# Patient Record
Sex: Male | Born: 1985 | Race: White | Hispanic: No | Marital: Single | Smoking: Never smoker
Health system: Southern US, Community
[De-identification: ages and names within clinical notes are randomized; demographics above are authoritative.]

## PROBLEM LIST (undated history)

## (undated) ENCOUNTER — Emergency Department (HOSPITAL_COMMUNITY): Admission: EM | Payer: No Typology Code available for payment source

## (undated) DIAGNOSIS — N200 Calculus of kidney: Secondary | ICD-10-CM

---

## 2015-03-23 ENCOUNTER — Emergency Department (HOSPITAL_COMMUNITY)
Admission: EM | Admit: 2015-03-23 | Discharge: 2015-03-23 | Disposition: A | Discharge status: Home / Self Care | Payer: Self-pay | Attending: Emergency Medicine | Admitting: Emergency Medicine

## 2015-03-23 ENCOUNTER — Encounter (HOSPITAL_COMMUNITY): Payer: Self-pay | Admitting: Neurology

## 2015-03-23 ENCOUNTER — Emergency Department (HOSPITAL_COMMUNITY): Payer: Self-pay

## 2015-03-23 DIAGNOSIS — N200 Calculus of kidney: Secondary | ICD-10-CM | POA: Insufficient documentation

## 2015-03-23 DIAGNOSIS — F17200 Nicotine dependence, unspecified, uncomplicated: Secondary | ICD-10-CM | POA: Insufficient documentation

## 2015-03-23 DIAGNOSIS — Z7982 Long term (current) use of aspirin: Secondary | ICD-10-CM | POA: Insufficient documentation

## 2015-03-23 LAB — COMPREHENSIVE METABOLIC PANEL
ALK PHOS: 120 U/L (ref 38–126)
ALT: 50 U/L (ref 17–63)
ANION GAP: 12 (ref 5–15)
AST: 32 U/L (ref 15–41)
Albumin: 4.9 g/dL (ref 3.5–5.0)
BILIRUBIN TOTAL: 0.8 mg/dL (ref 0.3–1.2)
BUN: 15 mg/dL (ref 6–20)
CALCIUM: 10.4 mg/dL — AB (ref 8.9–10.3)
CO2: 27 mmol/L (ref 22–32)
Chloride: 105 mmol/L (ref 101–111)
Creatinine, Ser: 1.19 mg/dL (ref 0.61–1.24)
Glucose, Bld: 137 mg/dL — ABNORMAL HIGH (ref 65–99)
Potassium: 4.7 mmol/L (ref 3.5–5.1)
Sodium: 144 mmol/L (ref 135–145)
TOTAL PROTEIN: 7.9 g/dL (ref 6.5–8.1)

## 2015-03-23 LAB — URINALYSIS, ROUTINE W REFLEX MICROSCOPIC
Bilirubin Urine: NEGATIVE
GLUCOSE, UA: NEGATIVE mg/dL
KETONES UR: 15 mg/dL — AB
Nitrite: NEGATIVE
PH: 7.5 (ref 5.0–8.0)
PROTEIN: 30 mg/dL — AB
Specific Gravity, Urine: 1.023 (ref 1.005–1.030)

## 2015-03-23 LAB — URINE MICROSCOPIC-ADD ON

## 2015-03-23 LAB — CBC
HCT: 46.1 % (ref 39.0–52.0)
HEMOGLOBIN: 15.7 g/dL (ref 13.0–17.0)
MCH: 30.1 pg (ref 26.0–34.0)
MCHC: 34.1 g/dL (ref 30.0–36.0)
MCV: 88.5 fL (ref 78.0–100.0)
PLATELETS: 223 10*3/uL (ref 150–400)
RBC: 5.21 MIL/uL (ref 4.22–5.81)
RDW: 13.2 % (ref 11.5–15.5)
WBC: 14.9 10*3/uL — AB (ref 4.0–10.5)

## 2015-03-23 LAB — LIPASE, BLOOD: Lipase: 27 U/L (ref 11–51)

## 2015-03-23 MED ORDER — CEPHALEXIN 500 MG PO CAPS: 500.0000 mg | ORAL_CAPSULE | Freq: Two times a day (BID) | ORAL | Status: DC

## 2015-03-23 MED ORDER — ONDANSETRON 4 MG PO TBDP: 4.0000 mg | ORAL_TABLET | Freq: Three times a day (TID) | ORAL | Status: DC | PRN

## 2015-03-23 MED ORDER — IBUPROFEN 800 MG PO TABS: 800.0000 mg | ORAL_TABLET | Freq: Three times a day (TID) | ORAL | Status: DC

## 2015-03-23 MED ORDER — ONDANSETRON 4 MG PO TBDP
4.0000 mg | ORAL_TABLET | Freq: Once | ORAL | Status: AC | PRN
  Administered 2015-03-23: 4 mg via ORAL

## 2015-03-23 MED ORDER — ONDANSETRON 4 MG PO TBDP
ORAL_TABLET | ORAL | Status: AC
  Filled 2015-03-23: qty 1

## 2015-03-23 MED ORDER — ONDANSETRON HCL 4 MG/2ML IJ SOLN
4.0000 mg | Freq: Once | INTRAMUSCULAR | Status: AC
  Administered 2015-03-23: 4 mg via INTRAVENOUS
  Filled 2015-03-23: qty 2

## 2015-03-23 MED ORDER — CYCLOBENZAPRINE HCL 10 MG PO TABS
10.0000 mg | ORAL_TABLET | Freq: Once | ORAL | Status: AC
  Administered 2015-03-23: 10 mg via ORAL
  Filled 2015-03-23: qty 1

## 2015-03-23 MED ORDER — SODIUM CHLORIDE 0.9 % IV BOLUS (SEPSIS)
1000.0000 mL | Freq: Once | INTRAVENOUS | Status: AC
  Administered 2015-03-23: 1000 mL via INTRAVENOUS

## 2015-03-23 NOTE — Discharge Instructions (Signed)
Take your medications as prescribed. Continue drinking fluids to remain hydrated. Please follow up with a primary care provider from the Resource Guide provided below in 4-5 days as needed. Please return to the Emergency Department if symptoms worsen or new onset of fever, abdominal pain, vomiting, diarrhea, blood in urine or stool, urinary retention, worsening back pain.   Emergency Department Resource Guide 1) Find a Doctor and Pay Out of Pocket Although you won't have to find out who is covered by your insurance plan, it is a good idea to ask around and get recommendations. You will then need to call the office and see if the doctor you have chosen will accept you as a new patient and what types of options they offer for patients who are self-pay. Some doctors offer discounts or will set up payment plans for their patients who do not have insurance, but you will need to ask so you aren't surprised when you get to your appointment.  2) Contact Your Local Health Department Not all health departments have doctors that can see patients for sick visits, but many do, so it is worth a call to see if yours does. If you don't know where your local health department is, you can check in your phone book. The CDC also has a tool to help you locate your state's health department, and many state websites also have listings of all of their local health departments.  3) Find a Walk-in Clinic If your illness is not likely to be very severe or complicated, you may want to try a walk in clinic. These are popping up all over the country in pharmacies, drugstores, and shopping centers. They're usually staffed by nurse practitioners or physician assistants that have been trained to treat common illnesses and complaints. They're usually fairly quick and inexpensive. However, if you have serious medical issues or chronic medical problems, these are probably not your best option.  No Primary Care Doctor: - Call Health  Connect at  [phone] - they can help you locate a primary care doctor that  accepts your insurance, provides certain services, etc. - Physician Referral Service- [phone]  Chronic Pain Problems: Organization         Address  Phone   Notes  [facility]  [phone] Patients need to be referred by their primary care doctor.   Medication Assistance: Organization         Address  Phone   Notes  [facility] Med[facility] [address] [place] [place], [place] [address] [phone] --Must be a resident of [facility] -- Must have NO insurance coverage whatsoever (no Medicaid/ Medicare, etc.) -- The pt. MUST have a primary care doctor that directs their care regularly and follows them in the community   MedAssist  [phone]   [employer]  [phone]    Agencies that provide inexpensive medical care: Organization         Address  Phone   Notes  [facility]  [phone]   [facility]    [phone]   [facility] [address] [place], [place] [address] [phone]   [facility] [address] [place]. [address], [place] [phone]   Planned Parenthood    [phone]   [facility]    [phone]   [facility]  [address], [place] Phone:  [phone], Fax:  [phone] Hours of Operation:  9 am - 6 pm, M-F.  Also accepts Medicaid/Medicare and self-pay.  [facility] [place], [address], [place] Phone: [phone], Fax: [phone]. Hours of Operation:  8:30 am - 5:30 pm, M-F.  Also accepts Medicaid and self-pay.  [facility] [place] [address] [place] Phone: [phone]   [place], [place], [place] [phone], Ext. [phone] Mondays & Thursdays: 7-9 AM.  First 15 patients are seen on a first come, first serve basis.     [place] Providers:  Organization         Address  Phone   Notes  [facility] [address], [place] [phone] Also accepts self-pay patients.  [facility] [address], [place]  [phone]   [place], [address], [place] [phone]   [facility] [address], [place] [phone]   Lucianne Lei [address], [place]   [phone] Only accepts [place] Access [place] patients after they have their name applied to their card.   Self-Pay (no insurance) in [facility]:  Organization         Address  Phone   Notes  Sickle Cell Patients, [facility] [place] [phone]   [facility] [place] [phone]   [facility] [place]  [place], [place], [place] [phone]   [facility]/Dr. Osei-Bonsu  [address] [place] or [address], [place] [phone] Phone number for both [place] and [place] locations is the same.  [facility] (1-Rh) [address], [place] [phone]   [facility] [address], [place] or [address] [phone]   [facility] [address] [place] [phone], phone; [phone], fax Sees patients 1st and 3rd Saturday of every month.  Must not qualify for public or private insurance (i.e. Medicaid, Medicare, [place] Health Choice, Veterans' Benefits)  Household income should be no more than 200% of the poverty level The clinic cannot treat you if you are pregnant or think you are pregnant  Sexually transmitted diseases are not treated at the clinic.    Dental Care: Organization         Address  Phone  Notes  [facility]  Department of [facility] [phone] Accepts children up to age 76 who are enrolled in [place] or [place]; pregnant women with a Medicaid card; and children who have applied for Medicaid or [place] Health Choice, but were declined, whose parents can pay a reduced fee at time of service.  [facility] of [facility]  [address], [place] [phone] Accepts children up to age 62 who are enrolled in [place] or [place]; pregnant women with a Medicaid card; and children who have applied for Medicaid or [place] Health Choice, but were declined, whose parents can pay a reduced fee at time of service.  [facility]  [place] [phone] Patients are seen by appointment only. Walk-ins are not accepted. [place]  [facility] will see patients 76 years of age and older. Monday - Tuesday (8am-5pm) Most Wednesdays (8:30-5pm) $30 per visit, cash only  [facility] Access PRO[facility]  [address], [facility] [phone] Patients are seen by appointment only. Walk-ins are not accepted. [place] will see patients 23 years of age and older. One Wednesday Evening (Monthly: Volunteer Based).  $30 per visit, cash only  [place]  [phone] for adults; Children under age 29, call Graduate Pediatric Dentistry at [phone]. Children aged 14-14, please call [phone] to request a pediatric application.  Dental services are provided in all areas of dental care including fillings, crowns and bridges, complete and partial dentures, implants, gum treatment, root canals, and extractions. Preventive care is also provided. Treatment is provided to both adults and children. Patients are selected via a lottery and there is often a waiting list.   [facility] [address] [place]  [phone]  [email or web address]   [facility] [address] [place], [place] [phone], Ext. [phone] Second and Fourth Thursday of each month, opens at 6:30 AM; Clinic ends at 9 AM.  Patients are seen on a first-come first-served basis, and a limited number are seen during each clinic.   [facility]  [address] Hillard Danker [place], [place] [phone]   Eligibility Requirements You must have lived in [place], [place], or [place] counties for at least the last three months.   You cannot be eligible for state or federal sponsored [employer], including [employer], [place], or [employer].   You generally cannot be eligible for healthcare insurance through your employer.    How to apply: Eligibility screenings are held every Tuesday and Wednesday afternoon from 1:00 pm until 4:00 pm. You do not need an appointment for the interview!  [facility] [address] [place], [place]   [place]  [place] Department  [place]  [phone]    Behavioral Health Resources in the Community: Intensive Outpatient Programs Organization         Address  Phone  Notes  [place]. [address], [place], [place] [phone]   [facility] Outpatient [address] [place], [place]   ADS: [facility] [address], [place], [place]   [place] [address]  [place], [place] or [phone]   Substance Abuse Resources Organization         Address  Phone  Notes  Al[facility] Drug Services  [phone]   [place]  [phone]   The [place]   Chinita Pester  [phone]   Residential & Outpatient Substance Abuse Program  [phone]   Psychological Services Organization          Address  Phone  Notes  [facility] [place]  [place]  [phone]   [place] [address] [place] or [phone]    Mobile Crisis Teams Organization         Address  Phone  Notes  [facility], Mobile Crisis Care Unit  [phone]   [facility]  [address] [place], [place]   [facility] [address] [place] [phone]    Self-Help/Support Groups Organization  Address  Phone             Notes  [place]. of [place] - variety of support groups  [place] Call for more information  Narcotics Anonymous (NA), [facility] [address], [employer] [place]  2 meetings at this location   Special educational needs teacher         Address  Phone  Notes  [facility] [place],    [place]  [phone]   [facility]  [address] [place] [phone], [place], [place]   [place], [place] [phone] Admissions: 8am-3pm M-F  Incentives Substance [place] [address],    [place], [place] [phone]   [facility] [address] [place], [place], [place]   [facility] [address]  [place], [place]   [facility] - Intensive Outpatient [address] Kristeen Mans [address], [place], [place]   [facility] ([place].,  [place], [place] [phone] or [phone]   [facility] (RTS) [address] [place], [place] Accepts Medicaid  [facility] [address],  [place] [phone] Substance Abuse/Addiction Treatment   [facility] Organization         Address  Phone  Notes  [facility]  [phone]   Domenic Schwab, PhD [address] Arlis Porta [place], [place]   [phone] or [phone]   [place], [place] [phone]   [facility] [address], [place], [place] [phone] Insurance/Medicaid/sponsorship through [facility] and Families [address] [place]                                    [place], [place] [phone] [place] [address], [place] [phone]    Dr. Adele Schilder  [phone]   [facility] Dept. 1) [address] [place] 2) [place] 3)  [place] [address], [place] [phone]  [phone]   [place] [phone] or [phone] (After Hours)

## 2015-03-23 NOTE — ED Notes (Addendum)
Pt reports LLQ abd pain since 1330 today, has been vomiting with chills. Denies diarrhea, LBM was today. Pt is a x 4. Denies urinary s/s.

## 2015-03-23 NOTE — ED Provider Notes (Signed)
CSN: [id number]     Arrival date & time 03/23/15  1730 History  By signing my name below, I, Stuart Cain, attest that this documentation has been prepared under the direction and in the presence of Stuart Cain, [place]. Electronically Signed: Ronney Cain, ED Scribe. 03/23/2015. 8:26 PM.    Chief Complaint  Patient presents with  . Abdominal Pain   The history is provided by the patient. No language interpreter was used.   HPI Comments: Stuart Cain is a 30 y.o. male who presents to the Emergency Department complaining of sudden-onset, constant, waxing and waning, sharp, 5/10 left-sided back pain radiating to his left abdomen that began this morning while at work. He also notes associated nausea that has since resolved, NBNB vomiting, subjective fever, mild dysuria, and urinary frequency. He states his pain began while standing and cooking - he suddenly felt like he pulled a muscle in his back. He states he soon after began to feel nauseated and vomited. Deep inspiration and bending exacerbates his pain. Patient states he last had a BM this morning. He denies any known sick contact. He also denies a history of any chronic medical conditions or abdominal surgeries. He denies hematemesis, cough, sore throat, nasal congestion, SOB, chest pain, diarrhea, hematuria, hematochezia, or penile discharge.    History reviewed. No pertinent past medical history. History reviewed. No pertinent past surgical history. No family history on file. Social History  Substance Use Topics  . Smoking status: Light Tobacco Smoker  . Smokeless tobacco: None  . Alcohol Use: Yes    Review of Systems  Constitutional: Positive for fever (subjective).  HENT: Negative for congestion and sore throat.   Respiratory: Negative for cough.   Cardiovascular: Negative for chest pain.  Gastrointestinal: Positive for nausea and vomiting. Negative for diarrhea and blood in stool.  Genitourinary: Positive for dysuria and frequency.  Negative for hematuria and discharge.  All other systems reviewed and are negative.     Allergies  Review of patient's allergies indicates no known allergies.  Home Medications   Prior to Admission medications   Medication Sig Start Date End Date Taking? Authorizing Provider  acetaminophen (TYLENOL) 500 MG tablet Take 1,000 mg by mouth every 6 (six) hours as needed for mild pain or headache.   Yes Historical Provider, MD  Aspirin-Salicylamide-Caffeine (BC HEADACHE POWDER PO) Take 1 packet by mouth daily as needed (general pain).   Yes Historical Provider, MD  cephALEXin (KEFLEX) 500 MG capsule Take 1 capsule (500 mg total) by mouth 2 (two) times daily. 03/23/15   Barrett Henle, PA-C  ibuprofen (ADVIL,MOTRIN) 800 MG tablet Take 1 tablet (800 mg total) by mouth 3 (three) times daily. 03/23/15   Barrett Henle, PA-C  ondansetron (ZOFRAN ODT) 4 MG disintegrating tablet Take 1 tablet (4 mg total) by mouth every 8 (eight) hours as needed for nausea or vomiting. 03/23/15   Barrett Henle, PA-C   BP 140/74 mmHg  Pulse 79  Temp(Src) 97.4 F (36.3 C) (Oral)  Resp 18  SpO2 100% Physical Exam  Constitutional: He is oriented to person, place, and time. He appears well-developed and well-nourished.  HENT:  Head: Normocephalic and atraumatic.  Mouth/Throat: Oropharynx is clear and moist. No oropharyngeal exudate, posterior oropharyngeal edema, posterior oropharyngeal erythema or tonsillar abscesses.  Eyes: Conjunctivae and EOM are normal. Right eye exhibits no discharge. Left eye exhibits no discharge. No scleral icterus.  Cardiovascular: Normal rate, regular rhythm, normal heart sounds and intact distal pulses.  Exam reveals  no gallop and no friction rub.   No murmur heard. Pulmonary/Chest: Effort normal and breath sounds normal. No respiratory distress. He has no wheezes. He has no rales. He exhibits no tenderness.  Abdominal: Soft. Bowel sounds are normal. He exhibits  no mass. There is no tenderness. There is no rebound, no guarding and no CVA tenderness.  No CVA tenderness.  Musculoskeletal: He exhibits tenderness. He exhibits no edema.  No midline C, T, or L tenderness. Full range of motion of neck and back. TTP over left lower thoracic paraspinal muscles, with palpable spasm noted. FROM of BLE, with 5/5 strength. Sensation intact. 2+ PT pulses. Pt able to stand and ambulate without assistance.   Neurological: He is alert and oriented to person, place, and time. He has normal strength and normal reflexes. No sensory deficit. Gait normal.  Skin: Skin is warm and dry.  Nursing note and vitals reviewed.   ED Course  Procedures (including critical care time)  DIAGNOSTIC STUDIES: Oxygen Saturation is 100% on RA, normal by my interpretation.    COORDINATION OF CARE: 7:45 PM - Discussed treatment plan with pt at bedside which includes awaiting UA results. Pt declines any nausea medications at this time. Pt verbalized understanding and agreed to plan. Pt's friend is driving him home today.  Labs Review Labs Reviewed  COMPREHENSIVE METABOLIC PANEL - Abnormal; Notable for the following:    Glucose, Bld 137 (*)    Calcium 10.4 (*)    All other components within normal limits  CBC - Abnormal; Notable for the following:    WBC 14.9 (*)    All other components within normal limits  URINALYSIS, ROUTINE W REFLEX MICROSCOPIC (NOT AT [facility]) - Abnormal; Notable for the following:    Color, Urine RED (*)    APPearance TURBID (*)    Hgb urine dipstick LARGE (*)    Ketones, ur 15 (*)    Protein, ur 30 (*)    Leukocytes, UA SMALL (*)    All other components within normal limits  URINE MICROSCOPIC-ADD ON - Abnormal; Notable for the following:    Squamous Epithelial / LPF 0-5 (*)    Bacteria, UA MANY (*)    All other components within normal limits  LIPASE, BLOOD    Imaging Review Ct Renal Stone Study  03/23/2015  CLINICAL DATA:  30 year old male with left  lower quadrant abdominal pain EXAM: CT ABDOMEN AND PELVIS WITHOUT CONTRAST TECHNIQUE: Multidetector CT imaging of the abdomen and pelvis was performed following the standard protocol without IV contrast. COMPARISON:  None. FINDINGS: Evaluation of this exam is limited in the absence of intravenous contrast. The visualized lung bases are clear. No intra-abdominal free air or free fluid. The liver, gallbladder, pancreas, spleen, and adrenal glands appear unremarkable. There multiple small nonobstructing bilateral renal calculi 3 mm in the interpolar aspect of the right kidney. There is minimal fullness of the left renal collecting system. There is no hydronephrosis on the right. There is minimal fullness of the left ureter. The right ureter appears unremarkable. There is a 3 mm calculus along the posterior wall of the urinary bladder adjacent to the left UVJ which may represent a recently passed left renal calculus versus a left UVJ stone. The prostate and seminal vesicles are grossly unremarkable. Small hiatal hernia. There is no evidence of bowel obstruction or inflammation. Normal appendix. The abdominal aorta and IVC appear unremarkable on this noncontrast study. No portal venous gas identified. There is no adenopathy. The abdominal wall soft  tissues appear unremarkable. There is a small fat containing umbilical hernia. The osseous structures are intact. IMPRESSION: A 3 mm recently passed left renal calculus versus a left UVJ stone. Small nonobstructing bilateral renal calculi. Electronically Signed   By: Elgie Collard M.D.   On: 03/23/2015 23:07   I have personally reviewed and evaluated these images and lab results as part of my medical decision-making.   EKG Interpretation None      MDM   Final diagnoses:  Kidney stone   Pt presents with left back pain and also reports having N/V, subjective fever, dysuria and urinary frequency. Denies any recent fall, trauma or injury. No back pain red flags.  VSS. Exam revealed left lower thoracic paraspinal muscle TTP with palpable spasm noted, no midline tenderness, no abdominal tenderness, no CVA tenderness. Pt given IVF, zofran and flexeril in the ED. WBC 14.9. UA positive for hgb, leukocytes, 0-5 WBCs, TNTC RBCs and many bacteria. Due to concern for kidney stone will order CT renal study for further evaluation. CT renal study showed 3mm recently passed left renal stone, nonobstructing. On reevaluation pt reports he is feeling better. Pt able to tolerate PO in the ED. No evidence of urosepsis or proximal UTI at this time. Plan to d/c pt home with keflex for distal UTI, zofran and symptomatic tx for suspected back muscle spasm. Pt given resource guide to follow up with PCP.   Evaluation does not show pathology requring ongoing emergent intervention or admission. Pt is hemodynamically stable and mentating appropriately. Discussed findings/results and plan with patient/guardian, who agrees with plan. All questions answered. Return precautions discussed and outpatient follow up given.    I personally performed the services described in this documentation, which was scribed in my presence. The recorded information has been reviewed and is accurate.     Satira Sark [place], PA-C 03/24/15 1719  Melene Plan, DO 03/26/15 1528

## 2015-03-23 NOTE — ED Notes (Signed)
Pt is in stable condition upon d/c and ambulates from ED. 

## 2016-06-24 ENCOUNTER — Emergency Department (HOSPITAL_COMMUNITY): Payer: No Typology Code available for payment source

## 2016-06-24 ENCOUNTER — Encounter (HOSPITAL_COMMUNITY): Payer: Self-pay

## 2016-06-24 ENCOUNTER — Inpatient Hospital Stay (HOSPITAL_COMMUNITY)
Admission: EM | Admit: 2016-06-24 | Discharge: 2016-06-27 | DRG: 494 | Disposition: A | Discharge status: Home / Self Care | Payer: No Typology Code available for payment source | Attending: Orthopedic Surgery | Admitting: Orthopedic Surgery

## 2016-06-24 DIAGNOSIS — M25531 Pain in right wrist: Secondary | ICD-10-CM | POA: Diagnosis present

## 2016-06-24 DIAGNOSIS — Z79899 Other long term (current) drug therapy: Secondary | ICD-10-CM | POA: Diagnosis not present

## 2016-06-24 DIAGNOSIS — Z419 Encounter for procedure for purposes other than remedying health state, unspecified: Secondary | ICD-10-CM

## 2016-06-24 DIAGNOSIS — S82141A Displaced bicondylar fracture of right tibia, initial encounter for closed fracture: Secondary | ICD-10-CM | POA: Diagnosis present

## 2016-06-24 DIAGNOSIS — S82131A Displaced fracture of medial condyle of right tibia, initial encounter for closed fracture: Secondary | ICD-10-CM | POA: Diagnosis present

## 2016-06-24 DIAGNOSIS — Z7982 Long term (current) use of aspirin: Secondary | ICD-10-CM | POA: Diagnosis not present

## 2016-06-24 DIAGNOSIS — F17290 Nicotine dependence, other tobacco product, uncomplicated: Secondary | ICD-10-CM | POA: Diagnosis present

## 2016-06-24 DIAGNOSIS — R6884 Jaw pain: Secondary | ICD-10-CM | POA: Diagnosis present

## 2016-06-24 DIAGNOSIS — M25532 Pain in left wrist: Secondary | ICD-10-CM | POA: Diagnosis present

## 2016-06-24 LAB — SURGICAL PCR SCREEN
MRSA, PCR: NEGATIVE
Staphylococcus aureus: POSITIVE — AB

## 2016-06-24 MED ORDER — HYDROCODONE-ACETAMINOPHEN 5-325 MG PO TABS
2.0000 | ORAL_TABLET | Freq: Once | ORAL | Status: AC
  Administered 2016-06-24: 2 via ORAL
  Filled 2016-06-24: qty 2

## 2016-06-24 MED ORDER — CEFAZOLIN SODIUM-DEXTROSE 2-4 GM/100ML-% IV SOLN
2.0000 g | INTRAVENOUS | Status: AC
  Administered 2016-06-25: 2 g via INTRAVENOUS
  Filled 2016-06-24: qty 100

## 2016-06-24 MED ORDER — MUPIROCIN 2 % EX OINT
1.0000 "application " | TOPICAL_OINTMENT | Freq: Two times a day (BID) | CUTANEOUS | Status: DC
  Administered 2016-06-24: 1 via NASAL
  Filled 2016-06-24: qty 22

## 2016-06-24 MED ORDER — KETOROLAC TROMETHAMINE 15 MG/ML IJ SOLN
15.0000 mg | Freq: Four times a day (QID) | INTRAMUSCULAR | Status: DC | PRN
  Administered 2016-06-24: 15 mg via INTRAVENOUS
  Filled 2016-06-24: qty 1

## 2016-06-24 MED ORDER — FENTANYL CITRATE (PF) 100 MCG/2ML IJ SOLN
25.0000 ug | INTRAMUSCULAR | Status: DC | PRN
  Administered 2016-06-24 – 2016-06-25 (×7): 50 ug via INTRAVENOUS
  Administered 2016-06-25: 100 ug via INTRAVENOUS
  Filled 2016-06-24 (×5): qty 2

## 2016-06-24 MED ORDER — POVIDONE-IODINE 10 % EX SWAB: 2.0000 "application " | Freq: Once | CUTANEOUS | Status: DC

## 2016-06-24 MED ORDER — HYDROCODONE-ACETAMINOPHEN 5-325 MG PO TABS
1.0000 | ORAL_TABLET | ORAL | Status: DC | PRN
  Administered 2016-06-24 – 2016-06-25 (×3): 2 via ORAL
  Filled 2016-06-24 (×3): qty 2

## 2016-06-24 MED ORDER — METHOCARBAMOL 1000 MG/10ML IJ SOLN
500.0000 mg | Freq: Four times a day (QID) | INTRAMUSCULAR | Status: DC | PRN
  Administered 2016-06-24: 500 mg via INTRAVENOUS
  Filled 2016-06-24 (×2): qty 5

## 2016-06-24 MED ORDER — ONDANSETRON HCL 4 MG PO TABS: 4.0000 mg | ORAL_TABLET | Freq: Four times a day (QID) | ORAL | Status: DC | PRN

## 2016-06-24 MED ORDER — SODIUM CHLORIDE 0.9 % IV SOLN
INTRAVENOUS | Status: DC
  Administered 2016-06-24: 16:00:00 via INTRAVENOUS

## 2016-06-24 MED ORDER — ONDANSETRON HCL 4 MG/2ML IJ SOLN
4.0000 mg | Freq: Four times a day (QID) | INTRAMUSCULAR | Status: DC | PRN
  Administered 2016-06-25: 4 mg via INTRAVENOUS

## 2016-06-24 MED ORDER — CHLORHEXIDINE GLUCONATE 4 % EX LIQD
60.0000 mL | Freq: Once | CUTANEOUS | Status: AC
  Administered 2016-06-25: 4 via TOPICAL
  Filled 2016-06-24: qty 60

## 2016-06-24 MED ORDER — CHLORHEXIDINE GLUCONATE CLOTH 2 % EX PADS
6.0000 | MEDICATED_PAD | Freq: Every day | CUTANEOUS | Status: DC
  Administered 2016-06-24: 6 via TOPICAL

## 2016-06-24 MED ORDER — CHLORHEXIDINE GLUCONATE 4 % EX LIQD
CUTANEOUS | Status: AC
  Administered 2016-06-24: 4
  Filled 2016-06-24: qty 15

## 2016-06-24 NOTE — ED Notes (Signed)
Patient transported to X-ray 

## 2016-06-24 NOTE — Progress Notes (Signed)
Pt continues to moan and cry out in pain. Dr. Charlann Boxerlin notified and toradol and robaxin ordered. Pt states pain is barely affected by fentanyl.

## 2016-06-24 NOTE — ED Notes (Signed)
Wound cleaned / dressing applied
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
## 2016-06-24 NOTE — ED Notes (Signed)
Patient transported to CT 
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
## 2016-06-25 ENCOUNTER — Encounter (HOSPITAL_COMMUNITY)
Admission: EM | Disposition: A | Discharge status: Home / Self Care | Payer: Self-pay | Source: Home / Self Care | Attending: Orthopedic Surgery

## 2016-06-25 ENCOUNTER — Inpatient Hospital Stay (HOSPITAL_COMMUNITY): Payer: No Typology Code available for payment source | Admitting: Certified Registered"

## 2016-06-25 ENCOUNTER — Encounter (HOSPITAL_COMMUNITY): Payer: Self-pay | Admitting: Certified Registered"

## 2016-06-25 ENCOUNTER — Inpatient Hospital Stay (HOSPITAL_COMMUNITY): Payer: No Typology Code available for payment source

## 2016-06-25 HISTORY — PX: ORIF TIBIA PLATEAU: SHX2132

## 2016-06-25 LAB — BASIC METABOLIC PANEL
Anion gap: 8 (ref 5–15)
BUN: 13 mg/dL (ref 6–20)
CHLORIDE: 103 mmol/L (ref 101–111)
CO2: 27 mmol/L (ref 22–32)
Calcium: 8.9 mg/dL (ref 8.9–10.3)
Creatinine, Ser: 1.11 mg/dL (ref 0.61–1.24)
GFR calc Af Amer: >60 mL/min (ref >60)
GFR calc non Af Amer: >60 mL/min (ref >60)
GLUCOSE: 101 mg/dL — AB (ref 65–99)
POTASSIUM: 4.3 mmol/L (ref 3.5–5.1)
Sodium: 138 mmol/L (ref 135–145)

## 2016-06-25 LAB — CBC
HEMATOCRIT: 41.2 % (ref 39.0–52.0)
Hemoglobin: 14 g/dL (ref 13.0–17.0)
MCH: 30.5 pg (ref 26.0–34.0)
MCHC: 34 g/dL (ref 30.0–36.0)
MCV: 89.8 fL (ref 78.0–100.0)
Platelets: 167 10*3/uL (ref 150–400)
RBC: 4.59 MIL/uL (ref 4.22–5.81)
RDW: 13.1 % (ref 11.5–15.5)
WBC: 9.8 10*3/uL (ref 4.0–10.5)

## 2016-06-25 LAB — HIV ANTIBODY (ROUTINE TESTING W REFLEX): HIV Screen 4th Generation wRfx: NONREACTIVE

## 2016-06-25 SURGERY — OPEN REDUCTION INTERNAL FIXATION (ORIF) TIBIAL PLATEAU
Anesthesia: General | Site: Leg Lower | Laterality: Right

## 2016-06-25 MED ORDER — KETOROLAC TROMETHAMINE 30 MG/ML IJ SOLN
INTRAMUSCULAR | Status: AC
  Filled 2016-06-25: qty 1

## 2016-06-25 MED ORDER — HYDROMORPHONE HCL 1 MG/ML IJ SOLN
INTRAMUSCULAR | Status: AC
  Filled 2016-06-25: qty 0.5

## 2016-06-25 MED ORDER — CEFAZOLIN SODIUM-DEXTROSE 1-4 GM/50ML-% IV SOLN
1.0000 g | Freq: Four times a day (QID) | INTRAVENOUS | Status: AC
  Administered 2016-06-25 – 2016-06-26 (×3): 1 g via INTRAVENOUS
  Filled 2016-06-25 (×3): qty 50

## 2016-06-25 MED ORDER — METOCLOPRAMIDE HCL 5 MG PO TABS: 5.0000 mg | ORAL_TABLET | Freq: Three times a day (TID) | ORAL | Status: DC | PRN

## 2016-06-25 MED ORDER — PHENYLEPHRINE 40 MCG/ML (10ML) SYRINGE FOR IV PUSH (FOR BLOOD PRESSURE SUPPORT)
PREFILLED_SYRINGE | INTRAVENOUS | Status: AC
  Filled 2016-06-25: qty 10

## 2016-06-25 MED ORDER — ACETAMINOPHEN 10 MG/ML IV SOLN
INTRAVENOUS | Status: AC
  Filled 2016-06-25: qty 100

## 2016-06-25 MED ORDER — CEFAZOLIN SODIUM 1 G IJ SOLR
INTRAMUSCULAR | Status: AC
  Filled 2016-06-25: qty 10

## 2016-06-25 MED ORDER — LACTATED RINGERS IV SOLN
INTRAVENOUS | Status: DC | PRN
  Administered 2016-06-25: 07:00:00 via INTRAVENOUS

## 2016-06-25 MED ORDER — FENTANYL CITRATE (PF) 100 MCG/2ML IJ SOLN
25.0000 ug | INTRAMUSCULAR | Status: DC | PRN
  Administered 2016-06-25 (×3): 25 ug via INTRAVENOUS

## 2016-06-25 MED ORDER — SUGAMMADEX SODIUM 200 MG/2ML IV SOLN
INTRAVENOUS | Status: DC | PRN
  Administered 2016-06-25: 158.8 mg via INTRAVENOUS

## 2016-06-25 MED ORDER — HYDROCODONE-ACETAMINOPHEN 7.5-325 MG PO TABS
1.0000 | ORAL_TABLET | ORAL | Status: DC | PRN
  Administered 2016-06-25: 1 via ORAL
  Administered 2016-06-25 – 2016-06-27 (×9): 2 via ORAL
  Filled 2016-06-25 (×10): qty 2

## 2016-06-25 MED ORDER — METHOCARBAMOL 500 MG PO TABS
500.0000 mg | ORAL_TABLET | Freq: Four times a day (QID) | ORAL | Status: DC | PRN
  Administered 2016-06-25 – 2016-06-27 (×5): 500 mg via ORAL
  Filled 2016-06-25 (×6): qty 1

## 2016-06-25 MED ORDER — DOCUSATE SODIUM 100 MG PO CAPS
100.0000 mg | ORAL_CAPSULE | Freq: Two times a day (BID) | ORAL | Status: DC
  Administered 2016-06-25 – 2016-06-27 (×5): 100 mg via ORAL
  Filled 2016-06-25 (×5): qty 1

## 2016-06-25 MED ORDER — ACETAMINOPHEN 650 MG RE SUPP: 650.0000 mg | Freq: Four times a day (QID) | RECTAL | Status: DC | PRN

## 2016-06-25 MED ORDER — MIDAZOLAM HCL 2 MG/2ML IJ SOLN
INTRAMUSCULAR | Status: AC
  Filled 2016-06-25: qty 2

## 2016-06-25 MED ORDER — BUPIVACAINE HCL (PF) 0.25 % IJ SOLN
INTRAMUSCULAR | Status: AC
  Filled 2016-06-25: qty 30

## 2016-06-25 MED ORDER — PROPOFOL 10 MG/ML IV BOLUS
INTRAVENOUS | Status: AC
  Filled 2016-06-25: qty 20

## 2016-06-25 MED ORDER — FENTANYL CITRATE (PF) 100 MCG/2ML IJ SOLN
50.0000 ug | INTRAMUSCULAR | Status: DC | PRN
  Administered 2016-06-25: 50 ug via INTRAVENOUS
  Filled 2016-06-25: qty 2

## 2016-06-25 MED ORDER — ONDANSETRON HCL 4 MG PO TABS: 4.0000 mg | ORAL_TABLET | Freq: Four times a day (QID) | ORAL | Status: DC | PRN

## 2016-06-25 MED ORDER — SUCCINYLCHOLINE CHLORIDE 20 MG/ML IJ SOLN
INTRAMUSCULAR | Status: DC | PRN
  Administered 2016-06-25: 100 mg via INTRAVENOUS

## 2016-06-25 MED ORDER — ROCURONIUM BROMIDE 100 MG/10ML IV SOLN
INTRAVENOUS | Status: DC | PRN
  Administered 2016-06-25: 30 mg via INTRAVENOUS
  Administered 2016-06-25: 20 mg via INTRAVENOUS

## 2016-06-25 MED ORDER — KETOROLAC TROMETHAMINE 30 MG/ML IJ SOLN
INTRAMUSCULAR | Status: DC | PRN
  Administered 2016-06-25: 30 mg via INTRAVENOUS

## 2016-06-25 MED ORDER — FENTANYL CITRATE (PF) 100 MCG/2ML IJ SOLN
INTRAMUSCULAR | Status: AC
Start: 2016-06-25 — End: 2016-06-25
  Administered 2016-06-25: 25 ug via INTRAVENOUS
  Filled 2016-06-25: qty 2

## 2016-06-25 MED ORDER — FENTANYL CITRATE (PF) 250 MCG/5ML IJ SOLN
INTRAMUSCULAR | Status: AC
  Filled 2016-06-25: qty 5

## 2016-06-25 MED ORDER — ASPIRIN 81 MG PO CHEW
81.0000 mg | CHEWABLE_TABLET | Freq: Two times a day (BID) | ORAL | Status: DC
  Administered 2016-06-25 – 2016-06-27 (×5): 81 mg via ORAL
  Filled 2016-06-25 (×5): qty 1

## 2016-06-25 MED ORDER — ACETAMINOPHEN 10 MG/ML IV SOLN
INTRAVENOUS | Status: DC | PRN
  Administered 2016-06-25: 1000 mg via INTRAVENOUS

## 2016-06-25 MED ORDER — PROPOFOL 10 MG/ML IV BOLUS
INTRAVENOUS | Status: DC | PRN
  Administered 2016-06-25: 150 mg via INTRAVENOUS

## 2016-06-25 MED ORDER — METHOCARBAMOL 1000 MG/10ML IJ SOLN
500.0000 mg | Freq: Four times a day (QID) | INTRAMUSCULAR | Status: DC | PRN
  Filled 2016-06-25: qty 5

## 2016-06-25 MED ORDER — SODIUM CHLORIDE 0.9 % IV SOLN
INTRAVENOUS | Status: DC
  Administered 2016-06-25 – 2016-06-26 (×2): via INTRAVENOUS

## 2016-06-25 MED ORDER — LIDOCAINE HCL (CARDIAC) 20 MG/ML IV SOLN
INTRAVENOUS | Status: DC | PRN
  Administered 2016-06-25: 50 mg via INTRAVENOUS

## 2016-06-25 MED ORDER — ONDANSETRON HCL 4 MG/2ML IJ SOLN
4.0000 mg | Freq: Four times a day (QID) | INTRAMUSCULAR | Status: DC | PRN
  Filled 2016-06-25: qty 2

## 2016-06-25 MED ORDER — ACETAMINOPHEN 325 MG PO TABS: 650.0000 mg | ORAL_TABLET | Freq: Four times a day (QID) | ORAL | Status: DC | PRN

## 2016-06-25 MED ORDER — MIDAZOLAM HCL 5 MG/5ML IJ SOLN
INTRAMUSCULAR | Status: DC | PRN
  Administered 2016-06-25: 2 mg via INTRAVENOUS

## 2016-06-25 MED ORDER — 0.9 % SODIUM CHLORIDE (POUR BTL) OPTIME
TOPICAL | Status: DC | PRN
  Administered 2016-06-25: 1000 mL

## 2016-06-25 MED ORDER — METOCLOPRAMIDE HCL 5 MG/ML IJ SOLN: 5.0000 mg | Freq: Three times a day (TID) | INTRAMUSCULAR | Status: DC | PRN

## 2016-06-25 SURGICAL SUPPLY — 91 items
BANDAGE ACE 4X5 VEL STRL LF (GAUZE/BANDAGES/DRESSINGS) ×3 IMPLANT
BANDAGE ACE 6X5 VEL STRL LF (GAUZE/BANDAGES/DRESSINGS) ×3 IMPLANT
BANDAGE ELASTIC 4 VELCRO ST LF (GAUZE/BANDAGES/DRESSINGS) ×3 IMPLANT
BANDAGE ELASTIC 6 VELCRO ST LF (GAUZE/BANDAGES/DRESSINGS) ×3 IMPLANT
BANDAGE ESMARK 6X9 LF (GAUZE/BANDAGES/DRESSINGS) ×1 IMPLANT
BIT DRILL 100X2.5XANTM LCK (BIT) ×1 IMPLANT
BIT DRL 100X2.5XANTM LCK (BIT) ×1
BLADE CLIPPER SURG (BLADE) IMPLANT
BLADE SURG 10 STRL SS (BLADE) ×3 IMPLANT
BLADE SURG 15 STRL LF DISP TIS (BLADE) ×1 IMPLANT
BLADE SURG 15 STRL SS (BLADE) ×2
BNDG ESMARK 6X9 LF (GAUZE/BANDAGES/DRESSINGS) ×3
BNDG GAUZE ELAST 4 BULKY (GAUZE/BANDAGES/DRESSINGS) ×3 IMPLANT
BONE CANC CHIPS 20CC PCAN1/4 (Bone Implant) ×3 IMPLANT
CHIPS CANC BONE 20CC PCAN1/4 (Bone Implant) ×1 IMPLANT
CLEANER TIP ELECTROSURG 2X2 (MISCELLANEOUS) ×3 IMPLANT
COVER MAYO STAND STRL (DRAPES) ×3 IMPLANT
CUFF TOURNIQUET SINGLE 34IN LL (TOURNIQUET CUFF) IMPLANT
CUFF TOURNIQUET SINGLE 44IN (TOURNIQUET CUFF) IMPLANT
DRAPE C-ARM 42X72 X-RAY (DRAPES) ×3 IMPLANT
DRAPE INCISE IOBAN 66X45 STRL (DRAPES) ×3 IMPLANT
DRAPE ORTHO SPLIT 77X108 STRL (DRAPES)
DRAPE SURG ORHT 6 SPLT 77X108 (DRAPES) IMPLANT
DRAPE U-SHAPE 47X51 STRL (DRAPES) ×3 IMPLANT
DRILL BIT 2.5MM (BIT) ×2
DRILL BIT 2.7X100 214235006 DU (MISCELLANEOUS) ×3 IMPLANT
DRSG ADAPTIC 3X8 NADH LF (GAUZE/BANDAGES/DRESSINGS) ×3 IMPLANT
DRSG PAD ABDOMINAL 8X10 ST (GAUZE/BANDAGES/DRESSINGS) ×12 IMPLANT
ELECT REM PT RETURN 9FT ADLT (ELECTROSURGICAL) ×3
ELECTRODE REM PT RTRN 9FT ADLT (ELECTROSURGICAL) ×1 IMPLANT
EVACUATOR 1/8 PVC DRAIN (DRAIN) IMPLANT
GAUZE SPONGE 4X4 12PLY STRL (GAUZE/BANDAGES/DRESSINGS) ×3 IMPLANT
GAUZE SPONGE 4X4 12PLY STRL LF (GAUZE/BANDAGES/DRESSINGS) ×3 IMPLANT
GAUZE XEROFORM 1X8 LF (GAUZE/BANDAGES/DRESSINGS) ×3 IMPLANT
GLOVE BIOGEL PI IND STRL 7.5 (GLOVE) ×1 IMPLANT
GLOVE BIOGEL PI IND STRL 8 (GLOVE) ×1 IMPLANT
GLOVE BIOGEL PI INDICATOR 7.5 (GLOVE) ×2
GLOVE BIOGEL PI INDICATOR 8 (GLOVE) ×2
GLOVE ORTHO TXT STRL SZ7.5 (GLOVE) ×3 IMPLANT
GLOVE SURG ORTHO 8.0 STRL STRW (GLOVE) ×3 IMPLANT
GOWN STRL REUS W/ TWL LRG LVL3 (GOWN DISPOSABLE) ×3 IMPLANT
GOWN STRL REUS W/TWL LRG LVL3 (GOWN DISPOSABLE) ×6
K-WIRE ACE 1.6X6 (WIRE) ×6
KIT BASIN OR (CUSTOM PROCEDURE TRAY) ×3 IMPLANT
KIT ROOM TURNOVER OR (KITS) ×3 IMPLANT
KWIRE ACE 1.6X6 (WIRE) ×2 IMPLANT
MANIFOLD NEPTUNE II (INSTRUMENTS) ×3 IMPLANT
NEEDLE 22X1 1/2 (OR ONLY) (NEEDLE) IMPLANT
NS IRRIG 1000ML POUR BTL (IV SOLUTION) ×3 IMPLANT
PACK ORTHO EXTREMITY (CUSTOM PROCEDURE TRAY) ×3 IMPLANT
PAD ABD 8X10 STRL (GAUZE/BANDAGES/DRESSINGS) ×6 IMPLANT
PAD ARMBOARD 7.5X6 YLW CONV (MISCELLANEOUS) ×6 IMPLANT
PAD CAST 4YDX4 CTTN HI CHSV (CAST SUPPLIES) ×1 IMPLANT
PADDING CAST COTTON 4X4 STRL (CAST SUPPLIES) ×2
PADDING CAST COTTON 6X4 STRL (CAST SUPPLIES) ×3 IMPLANT
PLATE LOCK LG 5H RT PROX TIB (Plate) ×3 IMPLANT
SCREW 3.5X85 LOPROFILE (Screw) ×6 IMPLANT
SCREW CORTICAL 3.5MM  42MM (Screw) ×2 IMPLANT
SCREW CORTICAL 3.5MM  44MM (Screw) ×2 IMPLANT
SCREW CORTICAL 3.5MM 38MM (Screw) ×3 IMPLANT
SCREW CORTICAL 3.5MM 40MM (Screw) ×3 IMPLANT
SCREW CORTICAL 3.5MM 42MM (Screw) ×1 IMPLANT
SCREW CORTICAL 3.5MM 44MM (Screw) ×1 IMPLANT
SCREW LOCK 3.5X70 816135070 (Screw) ×3 IMPLANT
SCREW LOCK CORT STAR 3.5X75 (Screw) ×3 IMPLANT
SCREW LOCK CORT STAR 3.5X80 (Screw) ×3 IMPLANT
SCREW LOW PROF CORTICAL 3.5X80 (Screw) ×3 IMPLANT
SPONGE LAP 18X18 X RAY DECT (DISPOSABLE) ×3 IMPLANT
STAPLER SKIN 35 REG (STAPLE) ×3 IMPLANT
STAPLER VISISTAT 35W (STAPLE) ×3 IMPLANT
STOCKINETTE IMPERVIOUS LG (DRAPES) ×3 IMPLANT
SUCTION FRAZIER HANDLE 10FR (MISCELLANEOUS) ×2
SUCTION TUBE FRAZIER 10FR DISP (MISCELLANEOUS) ×1 IMPLANT
SUT ETHIBOND 2 0 V5 (SUTURE) ×3 IMPLANT
SUT ETHILON 2 0 FS 18 (SUTURE) ×3 IMPLANT
SUT PROLENE 3 0 PS 1 (SUTURE) ×3 IMPLANT
SUT PROLENE 4 0 PS 2 18 (SUTURE) ×6 IMPLANT
SUT VIC AB 0 CT1 27 (SUTURE) ×4
SUT VIC AB 0 CT1 27XBRD ANBCTR (SUTURE) ×2 IMPLANT
SUT VIC AB 1 CT1 27 (SUTURE) ×6
SUT VIC AB 1 CT1 27XBRD ANBCTR (SUTURE) ×3 IMPLANT
SUT VIC AB 2-0 CT1 27 (SUTURE) ×4
SUT VIC AB 2-0 CT1 TAPERPNT 27 (SUTURE) ×2 IMPLANT
SYR 20ML ECCENTRIC (SYRINGE) IMPLANT
TOWEL OR 17X24 6PK STRL BLUE (TOWEL DISPOSABLE) ×3 IMPLANT
TOWEL OR 17X26 10 PK STRL BLUE (TOWEL DISPOSABLE) ×3 IMPLANT
TRAY FOLEY W/METER SILVER 16FR (SET/KITS/TRAYS/PACK) IMPLANT
TUBE CONNECTING 12'X1/4 (SUCTIONS) ×1
TUBE CONNECTING 12X1/4 (SUCTIONS) ×2 IMPLANT
WATER STERILE IRR 1000ML POUR (IV SOLUTION) ×6 IMPLANT
YANKAUER SUCT BULB TIP NO VENT (SUCTIONS) ×3 IMPLANT

## 2016-06-25 NOTE — Transfer of Care (Signed)
Immediate Anesthesia Transfer of Care Note  Patient: Stuart BjornstadWilliam Mickelson  Procedure(s) Performed: Procedure(s): OPEN REDUCTION INTERNAL FIXATION (ORIF) TIBIAL PLATEAU WITH BONE GRAFT  AND MENISCAL REPAIR (Right)  Patient Location: PACU  Anesthesia Type:General  Level of Consciousness: awake, alert , oriented and sedated  Airway & Oxygen Therapy: Patient connected to face mask oxygen  Post-op Assessment: Post -op Vital signs reviewed and stable  Post vital signs: stable  Last Vitals:  Vitals:   06/24/16 2008 06/25/16 0425  BP: 133/74 136/73  Pulse: 81 77  Resp: 18 18  Temp: 37 C 36.8 C    Last Pain:  Vitals:   06/25/16 0611  TempSrc:   PainSc: 8       Patients Stated Pain Goal: 2 (06/24/16 2023)  Complications: No apparent anesthesia complications

## 2016-06-25 NOTE — Brief Op Note (Signed)
06/24/2016 - 06/25/2016  10:12 AM  PATIENT:  Stuart Cain  31 y.o. male  PRE-OPERATIVE DIAGNOSIS:  Right closed split depressed Shatzker II tibial plateau fracture  POST-OPERATIVE DIAGNOSIS:   Right closed split depressed Shatzker II tibial plateau fracture  PROCEDURE:  Procedure(s): OPEN REDUCTION INTERNAL FIXATION (ORIF) TIBIAL PLATEAU WITH BONE GRAFT  AND MENISCAL REPAIR (Right)  SURGEON:  Surgeon(s) and Role:    Durene Romans* Valrie Jia, MD - Primary  PHYSICIAN ASSISTANT: None  ANESTHESIA:   general  EBL:  Total I/O In: -  Out: 200 [Blood:200]  BLOOD ADMINISTERED:none  DRAINS: none   LOCAL MEDICATIONS USED:  NONE  SPECIMEN:  No Specimen  DISPOSITION OF SPECIMEN:  N/A  COUNTS:  YES  TOURNIQUET:   Total Tourniquet Time Documented: Thigh (Right) - 71 minutes Total: Thigh (Right) - 71 minutes   DICTATION: .Other Dictation: Dictation Number [id number]  PLAN OF CARE: Admit to inpatient   PATIENT DISPOSITION:  PACU - hemodynamically stable.   Delay start of Pharmacological VTE agent (>24hrs) due to surgical blood loss or risk of bleeding: no
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
## 2016-06-25 NOTE — Progress Notes (Signed)
Patient ID: Stuart BjornstadWilliam Bergdoll, male   DOB: 04/09/1985, 31 y.o.   MRN: [id number] Right tibial plateau fracture Ready for OR Pain a little better controlled last night "getting use to it"  NPO Consent signed ORIF with bone graft right tibial plateau fracture

## 2016-06-25 NOTE — Anesthesia Procedure Notes (Signed)
Procedure Name: Intubation Date/Time: 06/25/2016 7:48 AM Performed by: Lavell Luster Pre-anesthesia Checklist: Patient identified, Emergency Drugs available, Suction available and Patient being monitored Patient Re-evaluated:Patient Re-evaluated prior to inductionOxygen Delivery Method: Circle system utilized Preoxygenation: Pre-oxygenation with 100% oxygen Intubation Type: IV induction Ventilation: Mask ventilation without difficulty Laryngoscope Size: Mac and 4 Grade View: Grade I Tube type: Oral Tube size: 7.5 mm Number of attempts: 1 Airway Equipment and Method: Stylet Placement Confirmation: ETT inserted through vocal cords under direct vision Secured at: 22 cm Tube secured with: Tape Dental Injury: Teeth and Oropharynx as per pre-operative assessment

## 2016-06-25 NOTE — Anesthesia Preprocedure Evaluation (Addendum)
Anesthesia Evaluation  Patient identified by MRN, date of birth, ID band Patient awake    Reviewed: Allergy & Precautions, NPO status , Patient's Chart, lab work & pertinent test results  Airway Mallampati: II  TM Distance: >3 FB     Dental   Pulmonary Current Smoker,    breath sounds clear to auscultation       Cardiovascular negative cardio ROS   Rhythm:Regular Rate:Normal     Neuro/Psych    GI/Hepatic negative GI ROS, Neg liver ROS,   Endo/Other    Renal/GU      Musculoskeletal   Abdominal   Peds  Hematology   Anesthesia Other Findings   Reproductive/Obstetrics                             Anesthesia Physical Anesthesia Plan  ASA: II  Anesthesia Plan: General   Post-op Pain Management:    Induction: Intravenous  Airway Management Planned: Oral ETT  Additional Equipment:   Intra-op Plan:   Post-operative Plan: Extubation in OR  Informed Consent: I have reviewed the patients History and Physical, chart, labs and discussed the procedure including the risks, benefits and alternatives for the proposed anesthesia with the patient or authorized representative who has indicated his/her understanding and acceptance.   Dental advisory given  Plan Discussed with: CRNA and Anesthesiologist  Anesthesia Plan Comments:         Anesthesia Quick Evaluation

## 2016-06-25 NOTE — Anesthesia Postprocedure Evaluation (Signed)
Anesthesia Post Note  Patient: Stuart Cain  Procedure(s) Performed: Procedure(s) (LRB): OPEN REDUCTION INTERNAL FIXATION (ORIF) TIBIAL PLATEAU WITH BONE GRAFT  AND MENISCAL REPAIR (Right)     Patient location during evaluation: PACU Anesthesia Type: General Level of consciousness: awake Pain management: pain level controlled Vital Signs Assessment: post-procedure vital signs reviewed and stable Respiratory status: spontaneous breathing Cardiovascular status: stable Anesthetic complications: no    Last Vitals:  Vitals:   06/25/16 1016 06/25/16 1023  BP:    Pulse: 94 76  Resp: (!) 25 17  Temp:      Last Pain:  Vitals:   06/25/16 1023  TempSrc:   PainSc: 6                  [name]

## 2016-06-25 NOTE — Anesthesia Preprocedure Evaluation (Addendum)
Anesthesia Evaluation  Patient identified by MRN, date of birth, ID band Patient awake    Reviewed: Allergy & Precautions, NPO status , Patient's Chart, lab work & pertinent test results  Airway Mallampati: I       Dental  (+) Loose, Chipped, Dental Advisory Given   Pulmonary Current Smoker,    breath sounds clear to auscultation       Cardiovascular negative cardio ROS   Rhythm:Regular Rate:Normal     Neuro/Psych    GI/Hepatic negative GI ROS, Neg liver ROS,   Endo/Other  negative endocrine ROS  Renal/GU negative Renal ROS     Musculoskeletal   Abdominal   Peds  Hematology   Anesthesia Other Findings   Reproductive/Obstetrics                            Anesthesia Physical Anesthesia Plan  ASA: III  Anesthesia Plan: General   Post-op Pain Management:    Induction: Intravenous  Airway Management Planned: Oral ETT  Additional Equipment:   Intra-op Plan:   Post-operative Plan: Extubation in OR  Informed Consent: I have reviewed the patients History and Physical, chart, labs and discussed the procedure including the risks, benefits and alternatives for the proposed anesthesia with the patient or authorized representative who has indicated his/her understanding and acceptance.   Dental advisory given  Plan Discussed with: CRNA and Anesthesiologist  Anesthesia Plan Comments:         Anesthesia Quick Evaluation

## 2016-06-26 ENCOUNTER — Encounter (HOSPITAL_COMMUNITY): Payer: Self-pay | Admitting: Orthopedic Surgery

## 2016-06-26 LAB — CBC
HCT: 38.8 % — ABNORMAL LOW (ref 39.0–52.0)
Hemoglobin: 13.3 g/dL (ref 13.0–17.0)
MCH: 30.4 pg (ref 26.0–34.0)
MCHC: 34.3 g/dL (ref 30.0–36.0)
MCV: 88.8 fL (ref 78.0–100.0)
PLATELETS: 153 10*3/uL (ref 150–400)
RBC: 4.37 MIL/uL (ref 4.22–5.81)
RDW: 12.8 % (ref 11.5–15.5)
WBC: 12.3 10*3/uL — AB (ref 4.0–10.5)

## 2016-06-26 LAB — BASIC METABOLIC PANEL
Anion gap: 6 (ref 5–15)
BUN: 9 mg/dL (ref 6–20)
CALCIUM: 8.6 mg/dL — AB (ref 8.9–10.3)
CHLORIDE: 102 mmol/L (ref 101–111)
CO2: 26 mmol/L (ref 22–32)
CREATININE: 0.9 mg/dL (ref 0.61–1.24)
GFR calc Af Amer: >60 mL/min (ref >60)
Glucose, Bld: 100 mg/dL — ABNORMAL HIGH (ref 65–99)
Potassium: 3.9 mmol/L (ref 3.5–5.1)
SODIUM: 134 mmol/L — AB (ref 135–145)

## 2016-06-26 LAB — POCT I-STAT 4, (NA,K, GLUC, HGB,HCT)
GLUCOSE: 100 mg/dL — AB (ref 65–99)
HCT: 43 % (ref 39.0–52.0)
Hemoglobin: 14.6 g/dL (ref 13.0–17.0)
Potassium: 3.8 mmol/L (ref 3.5–5.1)
SODIUM: 140 mmol/L (ref 135–145)

## 2016-06-26 MED ORDER — DOCUSATE SODIUM 100 MG PO CAPS: 100.0000 mg | ORAL_CAPSULE | Freq: Two times a day (BID) | ORAL | 0 refills | Status: AC

## 2016-06-26 MED ORDER — METHOCARBAMOL 500 MG PO TABS: 500.0000 mg | ORAL_TABLET | Freq: Four times a day (QID) | ORAL | 0 refills | Status: AC | PRN

## 2016-06-26 MED ORDER — ASPIRIN 81 MG PO CHEW: 81.0000 mg | CHEWABLE_TABLET | Freq: Two times a day (BID) | ORAL | 0 refills | Status: AC

## 2016-06-26 MED ORDER — HYDROCODONE-ACETAMINOPHEN 7.5-325 MG PO TABS: 1.0000 | ORAL_TABLET | ORAL | 0 refills | Status: AC | PRN

## 2016-06-26 MED ORDER — POLYETHYLENE GLYCOL 3350 17 G PO PACK: 17.0000 g | PACK | Freq: Two times a day (BID) | ORAL | 0 refills | Status: AC

## 2016-06-26 NOTE — Progress Notes (Signed)
Patient ID: Stuart BjornstadWilliam Granda, male   DOB: 03/03/1985, 31 y.o.   MRN: [id number] Subjective: 1 Day Post-Op Procedure(s) (LRB): OPEN REDUCTION INTERNAL FIXATION (ORIF) TIBIAL PLATEAU WITH BONE GRAFT  AND MENISCAL REPAIR (Right)    Patient reports pain as mild.  Pain thus challenged with moving his right foot.  Questions what activity can be done  Objective:   VITALS:   Vitals:   06/26/16 0027 06/26/16 0605  BP: 119/63 124/78  Pulse: 76 75  Resp: 16 16  Temp: 99.3 F (37.4 C) 98.9 F (37.2 C)    Sensation intact distally Intact pulses distally  His ace wrap is dry Moves toes and adducts and abducts foot Pain with passive dorisflexion stretch of ankle Compartments soft  LABS  Recent Labs  06/25/16 1015 06/26/16 0515  HGB 14.0 13.3  HCT 41.2 38.8*  WBC 9.8 12.3*  PLT 167 153     Recent Labs  06/25/16 1015 06/26/16 0515  NA 138 134*  K 4.3 3.9  BUN 13 9  CREATININE 1.11 0.90  GLUCOSE 101* 100*    No results for input(s): LABPT, INR in the last 72 hours.   Assessment/Plan: 1 Day Post-Op Procedure(s) (LRB): OPEN REDUCTION INTERNAL FIXATION (ORIF) TIBIAL PLATEAU WITH BONE GRAFT  AND MENISCAL REPAIR (Right)   Advance diet Up with therapy  NWB RLE   Knee motion encouraged Plan for discharge tomorrow Due to pain with activity and reluctance to dorsiflex at this point I am going to try and arrange for a AFO to be fit prior to discharge
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
## 2016-06-26 NOTE — Progress Notes (Addendum)
[facility], [facility] [address]. Provided pt with address and contact number. 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  Isidoro DonningAlesia Aaron Boeh RN CCM Case Mgmt phone [phone]

## 2016-06-26 NOTE — Progress Notes (Signed)
Messaged Dr. Charlann Boxerlin about faxing a note to patient's job about him being hospitalized.  Fax number is [phone]: [employer] is the company name.

## 2016-06-27 NOTE — Progress Notes (Signed)
Patient educated on discharge instructions by RN. Patient verbalized understanding of discharge instructions and verbalized understanding of daily dressing changes. Dr Charlann Boxerlin changed patient's dressing prior to patient's departure for discharge. Patient provided with copy of AVS and provided with printed and signed prescriptions. Patient provided with rolling walker. Patient taken downstairs by wheelchair where a friend will be driving him home.

## 2016-06-27 NOTE — Progress Notes (Signed)
Patient ID: Stuart BjornstadWilliam Cain, male   DOB: 01/20/1986, 31 y.o.   MRN: [id number] Subjective: 2 Days Post-Op Procedure(s) (LRB): OPEN REDUCTION INTERNAL FIXATION (ORIF) TIBIAL PLATEAU WITH BONE GRAFT  AND MENISCAL REPAIR (Right)    Patient reports pain as mild.  Feels he is improving, ready to go home  Objective:   VITALS:   Vitals:   06/26/16 2047 06/27/16 0552  BP: (!) 148/79 (!) 148/72  Pulse: (!) 108 (!) 104  Resp: 16 18  Temp: 99.3 F (37.4 C) 99.6 F (37.6 C)    Neurovascular intact Incision: dressing C/D/I - dressing changed today Still pain limiting doorsiflexion but intact sensibility over entire foot  LABS  Recent Labs  06/25/16 0743 06/25/16 1015 06/26/16 0515  HGB 14.6 14.0 13.3  HCT 43.0 41.2 38.8*  WBC  --  9.8 12.3*  PLT  --  167 153     Recent Labs  06/25/16 0743 06/25/16 1015 06/26/16 0515  NA 140 138 134*  K 3.8 4.3 3.9  BUN  --  13 9  CREATININE  --  1.11 0.90  GLUCOSE 100* 101* 100*    No results for input(s): LABPT, INR in the last 72 hours.   Assessment/Plan: 2 Days Post-Op Procedure(s) (LRB): OPEN REDUCTION INTERNAL FIXATION (ORIF) TIBIAL PLATEAU WITH BONE GRAFT  AND MENISCAL REPAIR (Right)   Up with therapy  Home today Rx on chart RTC in 10-12 days for staple removal and X-rays NXW RLE

## 2016-06-27 NOTE — Discharge Instructions (Signed)
Daily dressing changes to right leg as described with Xeroform gauze, dressing sponge and and ACE wrap Ok to wash leg just don't soak it  Non weight bearing Right Lower Extremity OK to move knee as much as you'd like
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
## 2016-06-27 NOTE — Progress Notes (Signed)
Patient may Dc when RW is delivered to room. [facility]

## 2016-07-03 ENCOUNTER — Ambulatory Visit: Payer: No Typology Code available for payment source | Attending: Orthopedic Surgery | Admitting: Physical Therapy

## 2016-07-03 ENCOUNTER — Encounter: Payer: Self-pay | Admitting: Physical Therapy

## 2016-07-03 DIAGNOSIS — R600 Localized edema: Secondary | ICD-10-CM

## 2016-07-03 DIAGNOSIS — M25661 Stiffness of right knee, not elsewhere classified: Secondary | ICD-10-CM

## 2016-07-03 DIAGNOSIS — M6281 Muscle weakness (generalized): Secondary | ICD-10-CM

## 2016-07-03 DIAGNOSIS — R262 Difficulty in walking, not elsewhere classified: Secondary | ICD-10-CM

## 2016-07-03 DIAGNOSIS — M25561 Pain in right knee: Secondary | ICD-10-CM | POA: Diagnosis present

## 2016-07-03 NOTE — Therapy (Signed)
[facility] [address] [place], [place], [address] Phone: [phone]   Fax:  [phone]  Physical Therapy Evaluation  Patient Details  Name: Stuart Cain MRN: [id number] Date of Birth: [birth year] Referring Provider: Durene Romans, MD  Encounter Date: 07/03/2016      PT End of Session - 07/03/16 1515    Visit Number 1   Number of Visits 48   Date for PT Re-Evaluation 12/22/16   Authorization Type self pay?   PT Start Time 1511  pt arrived late   PT Stop Time 1613   PT Time Calculation (min) 62 min   Activity Tolerance Patient tolerated treatment well   Behavior During Therapy [facility] for tasks assessed/performed      History reviewed. No pertinent past medical history.  Past Surgical History:  Procedure Laterality Date  . ORIF TIBIA PLATEAU Right 06/25/2016   Procedure: OPEN REDUCTION INTERNAL FIXATION (ORIF) TIBIAL PLATEAU WITH BONE GRAFT  AND MENISCAL REPAIR;  Surgeon: Durene Romans, MD;  Location: [facility] OR;  Service: Orthopedics;  Laterality: Right;    There were no vitals filed for this visit.       Subjective Assessment - 07/03/16 1516    Subjective A little medial knee pain today but took pain meds earlier.    Currently in Pain? Yes   Pain Score 2    Pain Location Knee   Pain Orientation Right;Medial            [facility] PT Assessment - 07/03/16 0001      Assessment   Medical Diagnosis s/p R tibial plateau fracture ORIF   Referring Provider Durene Romans, MD   Onset Date/Surgical Date 06/25/16   Next MD Visit unknown   Prior Therapy acute care     Precautions   Precautions Fall  weight bearing     Restrictions   Weight Bearing Restrictions Yes   Other Position/Activity Restrictions Non weight bearing     Balance Screen   Has the patient fallen in the past 6 months No     Home Environment   Living Environment Private residence   Living Arrangements Other relatives   Additional Comments no stairs  at home     Prior Function   Level of Independence Independent with basic ADLs     Cognition   Overall Cognitive Status Within Functional Limits for tasks assessed     Observation/Other Assessments   Focus on Therapeutic Outcomes (FOTO)  28% ability (goal 64%)     Sensation   Additional Comments WFL     ROM / Strength   AROM / PROM / Strength AROM;PROM;Strength     PROM   PROM Assessment Site Knee;Ankle   Right/Left Knee Right   Right Knee Extension 0   Right Knee Flexion 90   Right/Left Ankle Right   Right Ankle Dorsiflexion 0     Strength   Strength Assessment Site Hip;Knee;Ankle   Right/Left Knee Right   Right Knee Flexion --  unable to test at eval   Right Knee Extension 3-/5   Right/Left Ankle Right   Right Ankle Dorsiflexion 2+/5   Right Ankle Plantar Flexion 2+/5   Right Ankle Inversion 2+/5   Right Ankle Eversion 2+/5     Palpation   Palpation comment lower leg hypersensitivity     Ambulation/Gait   Assistive device Rolling walker   Gait Comments NWB            Objective measurements completed on examination: See above  findings.          [facility] Adult PT Treatment/Exercise - 07/03/16 0001      Exercises   Exercises Knee/Hip     Knee/Hip Exercises: Stretches   Gastroc Stretch Limitations gentle gastroc stretch with towel     Knee/Hip Exercises: Supine   Quad Sets 10 reps   Quad Sets Limitations long duration holds   Other Supine Knee/Hip Exercises toe extensions   Other Supine Knee/Hip Exercises attempted DF     Modalities   Modalities Cryotherapy     Cryotherapy   Number Minutes Cryotherapy 10 Minutes   Cryotherapy Location Knee   Type of Cryotherapy Ice pack     Manual Therapy   Manual therapy comments clean and rewrap incision                PT Education - 07/03/16 1604    Education provided Yes   Education Details anatomy of condtion, POC, HEP, exercise form/rationale, use of walker & shower chair,  desensitization, wear of contracture boot, watching for pressure sores, showering   Person(s) Educated Patient;Caregiver(s)   Methods Explanation;Demonstration;Tactile cues;Verbal cues;Handout   Comprehension Verbalized understanding;Returned demonstration;Verbal cues required;Tactile cues required;Need further instruction          PT Short Term Goals - 07/03/16 1636      PT SHORT TERM GOAL #1   Title Knee ROM within 10 deg of R LE by 9/7   Baseline limited to 0-90 at eval   Time 12   Period Weeks   Status New     PT SHORT TERM GOAL #2   Title Pt will demo proper heel toe gait pattern in full weight bearing for at least 50 ft.    Baseline NWB at eval   Time 12   Period Weeks   Status New     PT SHORT TERM GOAL #3   Title Gross hip and ankle MMT 4+/5 for appropraite support of knee joint from proximal and distal structures   Baseline see flowsheet   Time 12   Period Weeks   Status New     PT SHORT TERM GOAL #4   Title Pt will verbalize compliance with, and understanding of HEP as it has been progressed at this point   Baseline began establishing at evaluation   Time 12   Period Weeks   Status New           PT Long Term Goals - 07/03/16 1642      PT LONG TERM GOAL #1   Title FOTO to 64% ability to indicate significant improvement in functional ability by 11/30   Baseline 28% ability at eval   Time 24   Period Weeks   Status New     PT LONG TERM GOAL #2   Title Pt will demo proper lunge and squat to floor for proper lifting and standing from floor.    Baseline unable at eval   Time 24   Period Weeks   Status New     PT LONG TERM GOAL #3   Title Pt will ascend and descend stairs step over step without use of upper extremities   Baseline unable at eval   Time 24   Period Weeks   Status New     PT LONG TERM GOAL #4   Title Pt will demo steady SLS on solid and unsteady surfaces to demo proper support to knee joint   Baseline unable at eval   Time 24  Period Weeks   Status New     PT LONG TERM GOAL #5   Title Pt will begin return to running progression with mild to no discomfort from knee   Baseline unable at eval   Time 24   Period Weeks   Status New                Plan - 07/03/16 1604    Clinical Impression Statement Pt presents to PT with complaints of R leg pain and limited functional use s/p tibial plateau ORIF and medial meniscus repair. Wound on lateral R calcaneus measuring 1cm in length; no redness, swelling or oozing noted. Incision healing well without signs of infection. Pt unable to demo active DF and is in ankle contracture boot which he denies wearing at home. I asked that he only takes 1 hour breaks out of the contracture boot. Pt is unaware of when he is to return to MD for follow up. Significant edema noted in ankle and foot. Pt will benefit from skilled PT in order to improve functional use of lower extremity and return to PLOF.    History and Personal Factors relevant to plan of care: NONE   Clinical Presentation Evolving   Clinical Presentation due to: healing post op   Clinical Decision Making Low   Rehab Potential Good   PT Frequency Other (comment)  1/week prog to 2/week   PT Duration Other (comment)  1/wk 4 weeks, 2/wk 20 weeks   PT Treatment/Interventions ADLs/Self Care Home Management;Cryotherapy;Electrical Stimulation;Functional mobility training;Stair training;Gait training;DME Instruction;Moist Heat;Therapeutic activities;Therapeutic exercise;Balance training;Neuromuscular re-education;Patient/family education;Passive range of motion;Scar mobilization;Compression bandaging;Manual techniques;Taping;Vasopneumatic Device   PT Next Visit Plan knee ROM 0-90, edema control, quad strength, ankle 4 way   PT Home Exercise Plan ice 3/day, toe yoga, AAROM DF, quad sets;    Consulted and Agree with Plan of Care Patient;Family member/caregiver   Family Member Consulted sister      Patient will benefit from  skilled therapeutic intervention in order to improve the following deficits and impairments:  Abnormal gait, Decreased range of motion, Difficulty walking, Increased muscle spasms, Decreased activity tolerance, Decreased knowledge of precautions, Pain, Improper body mechanics, Impaired flexibility, Decreased scar mobility, Decreased knowledge of use of DME, Decreased balance, Decreased mobility, Decreased strength, Increased edema, Impaired sensation, Postural dysfunction  Visit Diagnosis: Acute pain of right knee - Plan: PT plan of care cert/re-cert  Localized edema - Plan: PT plan of care cert/re-cert  Difficulty in walking, not elsewhere classified - Plan: PT plan of care cert/re-cert  Stiffness of right knee, not elsewhere classified - Plan: PT plan of care cert/re-cert  Muscle weakness (generalized) - Plan: PT plan of care cert/re-cert     Problem List Patient Active Problem List   Diagnosis Date Noted  . Right medial tibial plateau fracture 06/24/2016   Mong Neal C. Kaeden Mester PT, DPT 07/03/16 4:52 PM   [facility] [address] [place], [place], [address] Phone: [phone]   Fax:  [phone]  Name: Stuart Cain MRN: [id number] Date of Birth: Apr 27, 1985
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
## 2016-07-11 ENCOUNTER — Ambulatory Visit: Payer: No Typology Code available for payment source | Admitting: Physical Therapy

## 2016-07-11 ENCOUNTER — Encounter: Payer: Self-pay | Admitting: Physical Therapy

## 2016-07-11 DIAGNOSIS — R262 Difficulty in walking, not elsewhere classified: Secondary | ICD-10-CM

## 2016-07-11 DIAGNOSIS — M25561 Pain in right knee: Secondary | ICD-10-CM

## 2016-07-11 DIAGNOSIS — R600 Localized edema: Secondary | ICD-10-CM

## 2016-07-11 DIAGNOSIS — M25661 Stiffness of right knee, not elsewhere classified: Secondary | ICD-10-CM

## 2016-07-11 DIAGNOSIS — M6281 Muscle weakness (generalized): Secondary | ICD-10-CM

## 2016-07-11 NOTE — Therapy (Signed)
[facility] [address] [place], [place], [address] Phone: [phone]   Fax:  [phone]  Physical Therapy Treatment  Patient Details  Name: Stuart BjornstadWilliam Cain MRN: [id number] Date of Birth: 12/03/1985 Referring Provider: Durene RomansMatthew Olin, MD  Encounter Date: 07/11/2016      PT End of Session - 07/11/16 1424    Visit Number 2   Number of Visits 48   Date for PT Re-Evaluation 12/22/16   Authorization Type self pay?   PT Start Time 1418   PT Stop Time 1507   PT Time Calculation (min) 49 min   Activity Tolerance Patient tolerated treatment well   [facility] assessed/performed      History reviewed. No pertinent past medical history.  Past Surgical History:  Procedure Laterality Date  . ORIF TIBIA PLATEAU Right 06/25/2016   Procedure: OPEN REDUCTION INTERNAL FIXATION (ORIF) TIBIAL PLATEAU WITH BONE GRAFT  AND MENISCAL REPAIR;  Surgeon: Durene Romanslin, Matthew, MD;  Location: [facility] OR;  Service: Orthopedics;  Laterality: Right;    There were no vitals filed for this visit.      Subjective Assessment - 07/11/16 1422    Subjective Has begun feeling tingling in anterior ankle and toes.    Currently in Pain? Yes   Pain Score 1    Pain Location Ankle   Pain Descriptors / Indicators --  tingling            [facility] PT Assessment - 07/11/16 0001      Assessment   Next MD Visit 7/11     Observation/Other Assessments-Edema    Edema --  pitting edema notable in R leg                     [facility] Adult PT Treatment/Exercise - 07/11/16 0001      Knee/Hip Exercises: Standing   Other Standing Knee Exercises standing in walker, set R foot flat on towel without weight shift     Knee/Hip Exercises: Supine   Quad Sets 20 reps   Quad Sets Limitations 10s holds   Short Arc [employer]ad Sets 20 reps   Short Arc Quad Sets Limitations paired with attempted DF   Heel Slides 20 reps     Modalities   Modalities  Vasopneumatic     Vasopneumatic   Number Minutes Vasopneumatic  15 minutes  5 concurrent with education   Vasopnuematic Location  Knee;Other (comment)  knee & lower leg   Vasopneumatic Pressure Low   Vasopneumatic Temperature  34 deg                PT Education - 07/11/16 1458    Education provided Yes   Education Details exercise form/rationale, HEP, incision care, precautions and return to walking, incision care   Person(s) Educated Patient;Other (comment)  sister   Methods Explanation;Demonstration;Tactile cues;Verbal cues;Handout   Comprehension Verbalized understanding;Returned demonstration;Verbal cues required;Tactile cues required;Need further instruction          PT Short Term Goals - 07/03/16 1636      PT SHORT TERM GOAL #1   Title Knee ROM within 10 deg of R LE by 9/7   Baseline limited to 0-90 at eval   Time 12   Period Weeks   Status New     PT SHORT TERM GOAL #2   Title Pt will demo proper heel toe gait pattern in full weight bearing for at least 50 ft.    Baseline NWB at eval  Time 12   Period Weeks   Status New     PT SHORT TERM GOAL #3   Title Gross hip and ankle MMT 4+/5 for appropraite support of knee joint from proximal and distal structures   Baseline see flowsheet   Time 12   Period Weeks   Status New     PT SHORT TERM GOAL #4   Title Pt will verbalize compliance with, and understanding of HEP as it has been progressed at this point   Baseline began establishing at evaluation   Time 12   Period Weeks   Status New           PT Long Term Goals - 07/03/16 1642      PT LONG TERM GOAL #1   Title FOTO to 64% ability to indicate significant improvement in functional ability by 11/30   Baseline 28% ability at eval   Time 24   Period Weeks   Status New     PT LONG TERM GOAL #2   Title Pt will demo proper lunge and squat to floor for proper lifting and standing from floor.    Baseline unable at eval   Time 24   Period Weeks    Status New     PT LONG TERM GOAL #3   Title Pt will ascend and descend stairs step over step without use of upper extremities   Baseline unable at eval   Time 24   Period Weeks   Status New     PT LONG TERM GOAL #4   Title Pt will demo steady SLS on solid and unsteady surfaces to demo proper support to knee joint   Baseline unable at eval   Time 24   Period Weeks   Status New     PT LONG TERM GOAL #5   Title Pt will begin return to running progression with mild to no discomfort from knee   Baseline unable at eval   Time 24   Period Weeks   Status New               Plan - 07/11/16 1459    Clinical Impression Statement Pitting edema noted at knee and below. Pt is able to demo active inversion and eversion but still lacking activation into DF. Has been stretching at home. Exercises to focus on activation of quads and hip abductors. Pt verbalized fatigue without increase in pain. Staples were removed, notable scabbing in corner of incision that does not appear to be fully closed, no sign of infection.    PT Treatment/Interventions ADLs/Self Care Home Management;Cryotherapy;Electrical Stimulation;Functional mobility training;Stair training;Gait training;DME Instruction;Moist Heat;Therapeutic activities;Therapeutic exercise;Balance training;Neuromuscular re-education;Patient/family education;Passive range of motion;Scar mobilization;Compression bandaging;Manual techniques;Taping;Vasopneumatic Device   PT Next Visit Plan knee ROM 0-90, edema control, quad strength, ankle 4 way   PT Home Exercise Plan ice 3/day, toe yoga, AAROM DF, quad sets; SLR, SAQ, hip abduction, quad set with long hold.    Consulted and Agree with Plan of Care Patient;Family member/caregiver   Family Member Consulted sister      Patient will benefit from skilled therapeutic intervention in order to improve the following deficits and impairments:  Abnormal gait, Decreased range of motion, Difficulty walking,  Increased muscle spasms, Decreased activity tolerance, Decreased knowledge of precautions, Pain, Improper body mechanics, Impaired flexibility, Decreased scar mobility, Decreased knowledge of use of DME, Decreased balance, Decreased mobility, Decreased strength, Increased edema, Impaired sensation, Postural dysfunction  Visit Diagnosis: Acute pain of right knee  Localized edema  Difficulty in walking, not elsewhere classified  Stiffness of right knee, not elsewhere classified  Muscle weakness (generalized)     Problem List Patient Active Problem List   Diagnosis Date Noted  . Right medial tibial plateau fracture 06/24/2016    Jaisa Defino C. Liba Hulsey PT, DPT 07/11/16 3:05 PM   [facility] [address] [place], [place], [address] Phone: [phone]   Fax:  [phone]  Name: Stuart Cain MRN: [id number] Date of Birth: 06/20/1985

## 2016-07-18 ENCOUNTER — Ambulatory Visit: Payer: No Typology Code available for payment source | Admitting: Physical Therapy

## 2016-07-18 ENCOUNTER — Encounter: Payer: Self-pay | Admitting: Physical Therapy

## 2016-07-18 DIAGNOSIS — M6281 Muscle weakness (generalized): Secondary | ICD-10-CM

## 2016-07-18 DIAGNOSIS — M25561 Pain in right knee: Secondary | ICD-10-CM | POA: Diagnosis not present

## 2016-07-18 DIAGNOSIS — R600 Localized edema: Secondary | ICD-10-CM

## 2016-07-18 DIAGNOSIS — R262 Difficulty in walking, not elsewhere classified: Secondary | ICD-10-CM

## 2016-07-18 DIAGNOSIS — M25661 Stiffness of right knee, not elsewhere classified: Secondary | ICD-10-CM

## 2016-07-18 NOTE — Therapy (Signed)
[facility] [address] [place], [place], [address] Phone: [phone]   Fax:  [phone]  Physical Therapy Treatment  Patient Details  Name: Stuart Cain MRN: [id number] Date of Birth: 12/30/1985 Referring Provider: Durene Romans, MD  Encounter Date: 07/18/2016      PT End of Session - 07/18/16 1414    Visit Number 3   Number of Visits 48   Date for PT Re-Evaluation 12/22/16   Authorization Type self pay   PT Start Time 1415   PT Stop Time 1508   PT Time Calculation (min) 53 min   Activity Tolerance Patient tolerated treatment well   Behavior During Therapy [facility] for tasks assessed/performed      History reviewed. No pertinent past medical history.  Past Surgical History:  Procedure Laterality Date  . ORIF TIBIA PLATEAU Right 06/25/2016   Procedure: OPEN REDUCTION INTERNAL FIXATION (ORIF) TIBIAL PLATEAU WITH BONE GRAFT  AND MENISCAL REPAIR;  Surgeon: Durene Romans, MD;  Location: [facility] OR;  Service: Orthopedics;  Laterality: Right;    There were no vitals filed for this visit.      Subjective Assessment - 07/18/16 1414    Subjective Pt reports noticing a blister on R knee that blistered, he scratched it because it itched and it popped. Denies pain otherwise. Is not doing sidelying stretches due to bug problem at home and fear of cleanliness but is moving soon.    Currently in Pain? No/denies                         [facility] Adult PT Treatment/Exercise - 07/18/16 0001      Exercises   Exercises Ankle     Knee/Hip Exercises: Seated   Clamshell with TheraBand Yellow   Other Seated Knee/Hip Exercises long sitting plantarflexion, red band to AAROM DF   Hamstring Limitations seated heel slides-both feet on pillow case-AA hamstring activation, seated  on low mat     Knee/Hip Exercises: Supine   Straight Leg Raises 20 reps   Straight Leg Raises Limitations cues to avoid quad lag   Straight Leg Raise with  External Rotation 20 reps     Knee/Hip Exercises: Sidelying   Hip ABduction 20 reps     Cryotherapy   Number Minutes Cryotherapy 10 Minutes   Cryotherapy Location Knee   Type of Cryotherapy Ice pack     Ankle Exercises: Seated   Towel Crunch Limitations toe crunches on pillow case   Heel Raises 20 reps;15 reps;10 reps;5 reps   Other Seated Ankle Exercises attempted toe yoga- unable to extend digits                PT Education - 07/18/16 1504    Education provided Yes   Education Details exercise form/rationale, HEP, wound care, precautions, amb with walker/crutches   Person(s) Educated Patient   Methods Explanation;Demonstration;Tactile cues;Verbal cues;Handout   Comprehension Verbalized understanding;Returned demonstration;Verbal cues required;Tactile cues required;Need further instruction          PT Short Term Goals - 07/03/16 1636      PT SHORT TERM GOAL #1   Title Knee ROM within 10 deg of R LE by 9/7   Baseline limited to 0-90 at eval   Time 12   Period Weeks   Status New     PT SHORT TERM GOAL #2   Title Pt will demo proper heel toe gait pattern in full weight bearing for at least 50 ft.  Baseline NWB at eval   Time 12   Period Weeks   Status New     PT SHORT TERM GOAL #3   Title Gross hip and ankle MMT 4+/5 for appropraite support of knee joint from proximal and distal structures   Baseline see flowsheet   Time 12   Period Weeks   Status New     PT SHORT TERM GOAL #4   Title Pt will verbalize compliance with, and understanding of HEP as it has been progressed at this point   Baseline began establishing at evaluation   Time 12   Period Weeks   Status New           PT Long Term Goals - 07/03/16 1642      PT LONG TERM GOAL #1   Title FOTO to 64% ability to indicate significant improvement in functional ability by 11/30   Baseline 28% ability at eval   Time 24   Period Weeks   Status New     PT LONG TERM GOAL #2   Title Pt will  demo proper lunge and squat to floor for proper lifting and standing from floor.    Baseline unable at eval   Time 24   Period Weeks   Status New     PT LONG TERM GOAL #3   Title Pt will ascend and descend stairs step over step without use of upper extremities   Baseline unable at eval   Time 24   Period Weeks   Status New     PT LONG TERM GOAL #4   Title Pt will demo steady SLS on solid and unsteady surfaces to demo proper support to knee joint   Baseline unable at eval   Time 24   Period Weeks   Status New     PT LONG TERM GOAL #5   Title Pt will begin return to running progression with mild to no discomfort from knee   Baseline unable at eval   Time 24   Period Weeks   Status New               Plan - 07/18/16 1423    Clinical Impression Statement Wounds: 1-midline patella 12-6 2 cm, 7/1 2.5 cm; lateral patella 12-6 1 cm, 7-1 1.5cm; both red with pink edges, no oozing. Pt able to easily flex knee to 90 deg, ext to 0 with stretching sensation at end range. Improved movement at ankle into inversion, eversion and toe flexion but still lacks anterior tibialis activation.     PT Treatment/Interventions ADLs/Self Care Home Management;Cryotherapy;Electrical Stimulation;Functional mobility training;Stair training;Gait training;DME Instruction;Moist Heat;Therapeutic activities;Therapeutic exercise;Balance training;Neuromuscular re-education;Patient/family education;Passive range of motion;Scar mobilization;Compression bandaging;Manual techniques;Taping;Vasopneumatic Device   PT Next Visit Plan ambulation with crutches   PT Home Exercise Plan ice 3/day, toe yoga, AAROM DF, quad sets; SLR, SAQ, hip abduction, quad set with long hold. seated clam, towel scrunches, long sitting PF-AAROM DF.    Consulted and Agree with Plan of Care Patient      Patient will benefit from skilled therapeutic intervention in order to improve the following deficits and impairments:  Abnormal gait,  Decreased range of motion, Difficulty walking, Increased muscle spasms, Decreased activity tolerance, Decreased knowledge of precautions, Pain, Improper body mechanics, Impaired flexibility, Decreased scar mobility, Decreased knowledge of use of DME, Decreased balance, Decreased mobility, Decreased strength, Increased edema, Impaired sensation, Postural dysfunction  Visit Diagnosis: Acute pain of right knee  Localized edema  Difficulty in walking, not  elsewhere classified  Stiffness of right knee, not elsewhere classified  Muscle weakness (generalized)     Problem List Patient Active Problem List   Diagnosis Date Noted  . Right medial tibial plateau fracture 06/24/2016    Dacey Milberger C. Mckenley Birenbaum PT, DPT 07/18/16 3:05 PM   [facility] [address] [place], [place], [address] Phone: [phone]   Fax:  [phone]  Name: Stuart Cain MRN: [id number] Date of Birth: 12/20/1985

## 2016-07-25 ENCOUNTER — Encounter: Payer: Self-pay | Admitting: Physical Therapy

## 2016-07-25 ENCOUNTER — Ambulatory Visit: Payer: No Typology Code available for payment source | Attending: Orthopedic Surgery | Admitting: Physical Therapy

## 2016-07-25 DIAGNOSIS — M25661 Stiffness of right knee, not elsewhere classified: Secondary | ICD-10-CM | POA: Insufficient documentation

## 2016-07-25 DIAGNOSIS — R600 Localized edema: Secondary | ICD-10-CM | POA: Diagnosis present

## 2016-07-25 DIAGNOSIS — M25561 Pain in right knee: Secondary | ICD-10-CM | POA: Diagnosis present

## 2016-07-25 DIAGNOSIS — R262 Difficulty in walking, not elsewhere classified: Secondary | ICD-10-CM | POA: Insufficient documentation

## 2016-07-25 DIAGNOSIS — M6281 Muscle weakness (generalized): Secondary | ICD-10-CM

## 2016-07-25 NOTE — Therapy (Addendum)
[facility] Outpatient Rehabilitation [facility] [address] [place], [place], [address] Phone: [phone]   Fax:  [phone]  Physical Therapy Treatment  Patient Details  Name: Stuart Cain MRN: [id number] Date of Birth: [birth year] Referring Provider: Durene Romans, MD  Encounter Date: 07/25/2016      PT End of Session - 07/25/16 1415    Visit Number 4   Number of Visits 48   Date for PT Re-Evaluation 12/22/16   Authorization Type self pay   PT Start Time 1415   PT Stop Time 1512   PT Time Calculation (min) 57 min   Activity Tolerance Patient tolerated treatment well   Behavior During Therapy [facility] for tasks assessed/performed      History reviewed. No pertinent past medical history.  Past Surgical History:  Procedure Laterality Date  . ORIF TIBIA PLATEAU Right 06/25/2016   Procedure: OPEN REDUCTION INTERNAL FIXATION (ORIF) TIBIAL PLATEAU WITH BONE GRAFT  AND MENISCAL REPAIR;  Surgeon: Durene Romans, MD;  Location: [facility] OR;  Service: Orthopedics;  Laterality: Right;    There were no vitals filed for this visit.      Subjective Assessment - 07/25/16 1415    Subjective Pt reports doing his HEP and feels like he is getting more movement. Occasional medial knee pain when twisting.    Currently in Pain? No/denies            [facility] PT Assessment - 07/25/16 0001      PROM   Right Ankle Dorsiflexion -4                     [facility] Adult PT Treatment/Exercise - 07/25/16 0001      Knee/Hip Exercises: Stretches   Passive Hamstring Stretch Limitations supine with green strap   Other Knee/Hip Stretches long sitting gastroc/hamstring stretch with strap     Knee/Hip Exercises: Standing   Gait Training with bilat axillary crutches     Knee/Hip Exercises: Supine   Straight Leg Raises 20 reps     Knee/Hip Exercises: Sidelying   Hip ABduction 15 reps   Hip ABduction Limitations +flexion/ext at hip     Knee/Hip Exercises: Prone   Hip Extension  Limitations with knee extended, to fatigue     Cryotherapy   Number Minutes Cryotherapy 15 Minutes   Cryotherapy Location Knee   Type of Cryotherapy Ice pack     Ankle Exercises: Seated   Other Seated Ankle Exercises attempted ankle movement   Other Seated Ankle Exercises plantar flexion into black tband     Ankle Exercises: Stretches   Other Stretch seated passive heel slides for DF stretch                  PT Short Term Goals - 07/03/16 1636      PT SHORT TERM GOAL #1   Title Knee ROM within 10 deg of R LE by 9/7   Baseline limited to 0-90 at eval   Time 12   Period Weeks   Status New     PT SHORT TERM GOAL #2   Title Pt will demo proper heel toe gait pattern in full weight bearing for at least 50 ft.    Baseline NWB at eval   Time 12   Period Weeks   Status New     PT SHORT TERM GOAL #3   Title Gross hip and ankle MMT 4+/5 for appropraite support of knee joint from proximal and distal structures   Baseline see  flowsheet   Time 12   Period Weeks   Status New     PT SHORT TERM GOAL #4   Title Pt will verbalize compliance with, and understanding of HEP as it has been progressed at this point   Baseline began establishing at evaluation   Time 12   Period Weeks   Status New           PT Long Term Goals - 07/03/16 1642      PT LONG TERM GOAL #1   Title FOTO to 64% ability to indicate significant improvement in functional ability by 11/30   Baseline 28% ability at eval   Time 24   Period Weeks   Status New     PT LONG TERM GOAL #2   Title Pt will demo proper lunge and squat to floor for proper lifting and standing from floor.    Baseline unable at eval   Time 24   Period Weeks   Status New     PT LONG TERM GOAL #3   Title Pt will ascend and descend stairs step over step without use of upper extremities   Baseline unable at eval   Time 24   Period Weeks   Status New     PT LONG TERM GOAL #4   Title Pt will demo steady SLS on solid and  unsteady surfaces to demo proper support to knee joint   Baseline unable at eval   Time 24   Period Weeks   Status New     PT LONG TERM GOAL #5   Title Pt will begin return to running progression with mild to no discomfort from knee   Baseline unable at eval   Time 24   Period Weeks   Status New               Plan - 07/25/16 1500    Clinical Impression Statement Good scabbing noted in blisters on patella with red ring around each of them. Soleus flexibility allows DF just past neutral but gastroc limits shy of 90 deg. Educated on importance of strengthening muscles as much as he can at this point so he is prepared for weight bearing. Pt was able to ambulate correctly with axillary crutches after cuing today.    PT Treatment/Interventions ADLs/Self Care Home Management;Cryotherapy;Electrical Stimulation;Functional mobility training;Stair training;Gait training;DME Instruction;Moist Heat;Therapeutic activities;Therapeutic exercise;Balance training;Neuromuscular re-education;Patient/family education;Passive range of motion;Scar mobilization;Compression bandaging;Manual techniques;Taping;Vasopneumatic Device   PT Next Visit Plan bike without resistance   PT Home Exercise Plan ice 3/day, toe yoga, AAROM DF, quad sets; SLR, SAQ, hip abduction, quad set with long hold. seated clam, towel scrunches, long sitting PF-AAROM DF. sidelying hip abd+flx/ext, prone hip ext   Consulted and Agree with Plan of Care Patient      Patient will benefit from skilled therapeutic intervention in order to improve the following deficits and impairments:  Abnormal gait, Decreased range of motion, Difficulty walking, Increased muscle spasms, Decreased activity tolerance, Decreased knowledge of precautions, Pain, Improper body mechanics, Impaired flexibility, Decreased scar mobility, Decreased knowledge of use of DME, Decreased balance, Decreased mobility, Decreased strength, Increased edema, Impaired sensation,  Postural dysfunction  Visit Diagnosis: Acute pain of right knee  Localized edema  Difficulty in walking, not elsewhere classified  Stiffness of right knee, not elsewhere classified  Muscle weakness (generalized)     Problem List Patient Active Problem List   Diagnosis Date Noted  . Right medial tibial plateau fracture 06/24/2016    Shanda Bumps  Dewain Penning. Koree Schopf PT, DPT 07/25/16 3:30 PM   [facility] [address] [place], [place], [address] Phone: [phone]   Fax:  [phone]  Name: Stuart Cain MRN: [id number] Date of Birth: 11/05/1985

## 2016-08-01 ENCOUNTER — Ambulatory Visit: Payer: No Typology Code available for payment source | Admitting: Physical Therapy

## 2016-08-01 ENCOUNTER — Encounter: Payer: Self-pay | Admitting: Physical Therapy

## 2016-08-01 DIAGNOSIS — R262 Difficulty in walking, not elsewhere classified: Secondary | ICD-10-CM

## 2016-08-01 DIAGNOSIS — R600 Localized edema: Secondary | ICD-10-CM

## 2016-08-01 DIAGNOSIS — M6281 Muscle weakness (generalized): Secondary | ICD-10-CM

## 2016-08-01 DIAGNOSIS — M25661 Stiffness of right knee, not elsewhere classified: Secondary | ICD-10-CM

## 2016-08-01 DIAGNOSIS — M25561 Pain in right knee: Secondary | ICD-10-CM

## 2016-08-01 NOTE — Therapy (Signed)
[facility] [address] [place], [place], [address] Phone: [phone]   Fax:  [phone]  Physical Therapy Treatment  Patient Details  Name: Stuart Cain MRN: [id number] Date of Birth: [birth year] Referring Provider: Durene Romans, MD  Encounter Date: 08/01/2016      PT End of Session - 08/01/16 1415    Visit Number 5   Number of Visits 48   Date for PT Re-Evaluation 12/22/16   Authorization Type self pay   PT Start Time 1415   PT Stop Time 1513   PT Time Calculation (min) 58 min   Activity Tolerance Patient tolerated treatment well   Behavior During Therapy [facility] for tasks assessed/performed      History reviewed. No pertinent past medical history.  Past Surgical History:  Procedure Laterality Date  . ORIF TIBIA PLATEAU Right 06/25/2016   Procedure: OPEN REDUCTION INTERNAL FIXATION (ORIF) TIBIAL PLATEAU WITH BONE GRAFT  AND MENISCAL REPAIR;  Surgeon: Durene Romans, MD;  Location: [facility] OR;  Service: Orthopedics;  Laterality: Right;    There were no vitals filed for this visit.      Subjective Assessment - 08/01/16 1415    Subjective medial knee pain when he lays certain ways.    Currently in Pain? No/denies            [facility] PT Assessment - 08/01/16 0001      PROM   Right Knee Extension 0   Right Knee Flexion 90  limit 90 per protocol until 7/15   Right Ankle Dorsiflexion 6     Strength   Right Knee Extension 4+/5   Right Ankle Dorsiflexion --  no notable activation   Right Ankle Plantar Flexion 3+/5   Right Ankle Inversion 3+/5   Right Ankle Eversion 4/5                     [facility] Adult PT Treatment/Exercise - 08/01/16 0001      Knee/Hip Exercises: Aerobic   Stationary Bike 5 min slow rotations  medial knee pain noted     Knee/Hip Exercises: Supine   Straight Leg Raises 20 reps   Straight Leg Raises Limitations +knee fx/ext     Knee/Hip Exercises: Sidelying   Hip ABduction 20  reps;10 reps   Clams x30     Vasopneumatic   Number Minutes Vasopneumatic  15 minutes   Vasopnuematic Location  Ankle  concurrent with ice on knee   Vasopneumatic Pressure Low   Vasopneumatic Temperature  34 deg     Ankle Exercises: Seated   Heel Raises 20 reps   Other Seated Ankle Exercises tennis ball roll plantar fascia     Additional Ankle Exercises DO NOT USE   Towel Crunch Limitations bilateral toe scrunches                PT Education - 08/01/16 1738    Education provided Yes   Education Details exercise form/rationale, manual rationale, AFO, progress through protocol for healing   Person(s) Educated Patient   Methods Explanation;Demonstration;Tactile cues;Verbal cues   Comprehension Verbalized understanding;Returned demonstration;Verbal cues required;Need further instruction;Tactile cues required          PT Short Term Goals - 07/03/16 1636      PT SHORT TERM GOAL #1   Title Knee ROM within 10 deg of R LE by 9/7   Baseline limited to 0-90 at eval   Time 12   Period Weeks   Status New  PT SHORT TERM GOAL #2   Title Pt will demo proper heel toe gait pattern in full weight bearing for at least 50 ft.    Baseline NWB at eval   Time 12   Period Weeks   Status New     PT SHORT TERM GOAL #3   Title Gross hip and ankle MMT 4+/5 for appropraite support of knee joint from proximal and distal structures   Baseline see flowsheet   Time 12   Period Weeks   Status New     PT SHORT TERM GOAL #4   Title Pt will verbalize compliance with, and understanding of HEP as it has been progressed at this point   Baseline began establishing at evaluation   Time 12   Period Weeks   Status New           PT Long Term Goals - 07/03/16 1642      PT LONG TERM GOAL #1   Title FOTO to 64% ability to indicate significant improvement in functional ability by 11/30   Baseline 28% ability at eval   Time 24   Period Weeks   Status New     PT LONG TERM GOAL #2    Title Pt will demo proper lunge and squat to floor for proper lifting and standing from floor.    Baseline unable at eval   Time 24   Period Weeks   Status New     PT LONG TERM GOAL #3   Title Pt will ascend and descend stairs step over step without use of upper extremities   Baseline unable at eval   Time 24   Period Weeks   Status New     PT LONG TERM GOAL #4   Title Pt will demo steady SLS on solid and unsteady surfaces to demo proper support to knee joint   Baseline unable at eval   Time 24   Period Weeks   Status New     PT LONG TERM GOAL #5   Title Pt will begin return to running progression with mild to no discomfort from knee   Baseline unable at eval   Time 24   Period Weeks   Status New               Plan - 08/01/16 1735    Clinical Impression Statement Good healing noted of wounds. Cont to lack anterior tibialis & extensor digitorum activation in R leg. Pt will be at 6 week mark on 7/15 where we will begin increasing knee ROM and begin TTWB per protocol. 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 Medial knee pain noted. Will continue to progress as tolerated. Pt visit with MD tomorrow.    PT Treatment/Interventions ADLs/Self Care Home Management;Cryotherapy;Electrical Stimulation;Functional mobility training;Stair training;Gait training;DME Instruction;Moist Heat;Therapeutic activities;Therapeutic exercise;Balance training;Neuromuscular re-education;Patient/family education;Passive range of motion;Scar mobilization;Compression bandaging;Manual techniques;Taping;Vasopneumatic Device   PT Next Visit Plan bike, visit with MD- AFO? hamstring activation?   PT Home Exercise Plan ice 3/day, toe yoga, AAROM DF, quad sets; SLR, SAQ, hip abduction, quad set with long hold. seated clam, towel scrunches, long sitting PF-AAROM DF. sidelying hip abd+flx/ext, prone hip ext   Consulted and Agree with Plan of Care Patient      Patient will benefit from  skilled therapeutic intervention in order to improve the following deficits and impairments:  Abnormal gait, Decreased range of motion, Difficulty walking, Increased muscle spasms, Decreased activity tolerance, Decreased knowledge of precautions, Pain,  Improper body mechanics, Impaired flexibility, Decreased scar mobility, Decreased knowledge of use of DME, Decreased balance, Decreased mobility, Decreased strength, Increased edema, Impaired sensation, Postural dysfunction  Visit Diagnosis: Acute pain of right knee  Localized edema  Difficulty in walking, not elsewhere classified  Stiffness of right knee, not elsewhere classified  Muscle weakness (generalized)     Problem List Patient Active Problem List   Diagnosis Date Noted  . Right medial tibial plateau fracture 06/24/2016   Brodie Correll C. Ariell Gunnels PT, DPT 08/01/16 5:40 PM   [facility] Out[facility] [address] [place], [place], [address] Phone: [phone]   Fax:  [phone]  Name: Quinterious Walraven MRN: [id number] Date of Birth: 21-Jul-1985

## 2016-08-08 ENCOUNTER — Ambulatory Visit: Payer: No Typology Code available for payment source | Admitting: Physical Therapy

## 2016-08-08 ENCOUNTER — Encounter: Payer: Self-pay | Admitting: Physical Therapy

## 2016-08-08 DIAGNOSIS — R262 Difficulty in walking, not elsewhere classified: Secondary | ICD-10-CM

## 2016-08-08 DIAGNOSIS — M25561 Pain in right knee: Secondary | ICD-10-CM

## 2016-08-08 DIAGNOSIS — M25661 Stiffness of right knee, not elsewhere classified: Secondary | ICD-10-CM

## 2016-08-08 DIAGNOSIS — M6281 Muscle weakness (generalized): Secondary | ICD-10-CM

## 2016-08-08 DIAGNOSIS — R600 Localized edema: Secondary | ICD-10-CM

## 2016-08-08 NOTE — Therapy (Signed)
[facility] [address] [place], [place], [address] Phone: [phone]   Fax:  [phone]  Physical Therapy Treatment  Patient Details  Name: Shoaib Siefker MRN: [id number] Date of Birth: 1985/05/07 Referring Provider: Durene Romans, MD  Encounter Date: 08/08/2016      PT End of Session - 08/08/16 1417    Visit Number 6   Number of Visits 48   Date for PT Re-Evaluation 12/22/16   Authorization Type self pay   PT Start Time 1417   PT Stop Time 1510   PT Time Calculation (min) 53 min   Activity Tolerance Patient tolerated treatment well   Behavior During Therapy [facility] for tasks assessed/performed      History reviewed. No pertinent past medical history.  Past Surgical History:  Procedure Laterality Date  . ORIF TIBIA PLATEAU Right 06/25/2016   Procedure: OPEN REDUCTION INTERNAL FIXATION (ORIF) TIBIAL PLATEAU WITH BONE GRAFT  AND MENISCAL REPAIR;  Surgeon: Durene Romans, MD;  Location: [facility] OR;  Service: Orthopedics;  Laterality: Right;    There were no vitals filed for this visit.      Subjective Assessment - 08/08/16 1417    Subjective Allowed 50% WB. F/u in 6 weeks. Cont medial knee pain. Script signed for AFO. Ok for TENS to anterior tibialis   Currently in Pain? No/denies                         [facility] Adult PT Treatment/Exercise - 08/08/16 0001      Knee/Hip Exercises: Stretches   Passive Hamstring Stretch Limitations supine + DF stretch using towel     Knee/Hip Exercises: Aerobic   Nustep 5 min L5  LE only     Knee/Hip Exercises: Standing   Gait Training with walker, 50% WB   Other Standing Knee Exercises 50% WB with scale  80lb     Knee/Hip Exercises: Supine   Straight Leg Raises 20 reps   Straight Leg Raises Limitations SAQ to SLR     Modalities   Modalities Electrical Stimulation     Cryotherapy   Number Minutes Cryotherapy 10 Minutes   Cryotherapy Location Knee   Type of  Cryotherapy Ice pack     Electrical Stimulation   Electrical Stimulation Location R anterior tib, intramuscular   Electrical Stimulation Action TENS   Electrical Stimulation Parameters intensity to tolerance   Electrical Stimulation Goals Strength;Neuromuscular facilitation                PT Education - 08/08/16 1519    Education provided Yes   Education Details rationale for treatment, gait pattern, increasing WB, AFO   Person(s) Educated Patient   Methods Explanation;Demonstration;Tactile cues;Verbal cues   Comprehension Verbalized understanding;Returned demonstration;Verbal cues required;Tactile cues required;Need further instruction          PT Short Term Goals - 07/03/16 1636      PT SHORT TERM GOAL #1   Title Knee ROM within 10 deg of R LE by 9/7   Baseline limited to 0-90 at eval   Time 12   Period Weeks   Status New     PT SHORT TERM GOAL #2   Title Pt will demo proper heel toe gait pattern in full weight bearing for at least 50 ft.    Baseline NWB at eval   Time 12   Period Weeks   Status New     PT SHORT TERM GOAL #3   Title Gross  hip and ankle MMT 4+/5 for appropraite support of knee joint from proximal and distal structures   Baseline see flowsheet   Time 12   Period Weeks   Status New     PT SHORT TERM GOAL #4   Title Pt will verbalize compliance with, and understanding of HEP as it has been progressed at this point   Baseline began establishing at evaluation   Time 12   Period Weeks   Status New           PT Long Term Goals - 07/03/16 1642      PT LONG TERM GOAL #1   Title FOTO to 64% ability to indicate significant improvement in functional ability by 11/30   Baseline 28% ability at eval   Time 24   Period Weeks   Status New     PT LONG TERM GOAL #2   Title Pt will demo proper lunge and squat to floor for proper lifting and standing from floor.    Baseline unable at eval   Time 24   Period Weeks   Status New     PT LONG  TERM GOAL #3   Title Pt will ascend and descend stairs step over step without use of upper extremities   Baseline unable at eval   Time 24   Period Weeks   Status New     PT LONG TERM GOAL #4   Title Pt will demo steady SLS on solid and unsteady surfaces to demo proper support to knee joint   Baseline unable at eval   Time 24   Period Weeks   Status New     PT LONG TERM GOAL #5   Title Pt will begin return to running progression with mild to no discomfort from knee   Baseline unable at eval   Time 24   Period Weeks   Status New               Plan - 08/08/16 1520    Clinical Impression Statement Good recognition to 80 lb WB (50%). Reported some pain in foot at that weight. AFO fitting scheduled for Thursday. Minor twitch noted with TENS today, pt reported feeling twitch but unable to perform active DF.    PT Treatment/Interventions ADLs/Self Care Home Management;Cryotherapy;Electrical Stimulation;Functional mobility training;Stair training;Gait training;DME Instruction;Moist Heat;Therapeutic activities;Therapeutic exercise;Balance training;Neuromuscular re-education;Patient/family education;Passive range of motion;Scar mobilization;Compression bandaging;Manual techniques;Taping;Vasopneumatic Device   PT Next Visit Plan bike/nustep, hamstring activation (light), 50% WB per MD   PT Home Exercise Plan ice 3/day, toe yoga, AAROM DF, quad sets; SLR, SAQ, hip abduction, quad set with long hold. seated clam, towel scrunches, long sitting PF-AAROM DF. sidelying hip abd+flx/ext, prone hip ext   Consulted and Agree with Plan of Care Patient      Patient will benefit from skilled therapeutic intervention in order to improve the following deficits and impairments:  Abnormal gait, Decreased range of motion, Difficulty walking, Increased muscle spasms, Decreased activity tolerance, Decreased knowledge of precautions, Pain, Improper body mechanics, Impaired flexibility, Decreased scar  mobility, Decreased knowledge of use of DME, Decreased balance, Decreased mobility, Decreased strength, Increased edema, Impaired sensation, Postural dysfunction  Visit Diagnosis: Acute pain of right knee  Localized edema  Difficulty in walking, not elsewhere classified  Stiffness of right knee, not elsewhere classified  Muscle weakness (generalized)     Problem List Patient Active Problem List   Diagnosis Date Noted  . Right medial tibial plateau fracture 06/24/2016    Shanda BumpsJessica  Dewain Penning PT, DPT 08/08/16 3:24 PM   [facility] [address] [place], [place], [address] Phone: [phone]   Fax:  [phone]  Name: Demir Titsworth MRN: [id number] Date of Birth: 1985-03-17

## 2016-08-10 ENCOUNTER — Ambulatory Visit: Payer: No Typology Code available for payment source | Admitting: Physical Therapy

## 2016-08-10 ENCOUNTER — Encounter: Payer: Self-pay | Admitting: Physical Therapy

## 2016-08-10 DIAGNOSIS — M25561 Pain in right knee: Secondary | ICD-10-CM

## 2016-08-10 DIAGNOSIS — M25661 Stiffness of right knee, not elsewhere classified: Secondary | ICD-10-CM

## 2016-08-10 DIAGNOSIS — R600 Localized edema: Secondary | ICD-10-CM

## 2016-08-10 DIAGNOSIS — M6281 Muscle weakness (generalized): Secondary | ICD-10-CM

## 2016-08-10 DIAGNOSIS — R262 Difficulty in walking, not elsewhere classified: Secondary | ICD-10-CM

## 2016-08-10 NOTE — Therapy (Addendum)
[place], [place], [address] Phone: [phone]   Fax:  [phone]  Physical Therapy Treatment/Discharge Summary  Patient Details  Name: Stuart Cain MRN: [id number] Date of Birth: Aug 11, 1985 Referring Provider: Paralee Cancel, MD  Encounter Date: 08/10/2016      PT End of Session - 08/10/16 1539    Visit Number 7   Number of Visits 48   Date for PT Re-Evaluation 12/22/16   Authorization Type self pay   PT Start Time 1540   PT Stop Time 1645   PT Time Calculation (min) 65 min   Activity Tolerance Patient tolerated treatment well   Behavior During Therapy [facility] for tasks assessed/performed      History reviewed. No pertinent past medical history.  Past Surgical History:  Procedure Laterality Date  . ORIF TIBIA PLATEAU Right 06/25/2016   Procedure: OPEN REDUCTION INTERNAL FIXATION (ORIF) TIBIAL PLATEAU WITH BONE GRAFT  AND MENISCAL REPAIR;  Surgeon: Paralee Cancel, MD;  Location: [place];  Service: Orthopedics;  Laterality: Right;    There were no vitals filed for this visit.      Subjective Assessment - 08/10/16 1540    Subjective Went for AFO fitting but is unable at this time for financial restrictions, will discuss with lawyer. Sore around ankle with added weight bearing.    Currently in Pain? Yes   Pain Score 4    Pain Location Ankle   Pain Orientation Right   Pain Descriptors / Indicators Sore   Aggravating Factors  weight bearing   Pain Relieving Factors rest                         [facility] Adult PT Treatment/Exercise - 08/10/16 0001      Knee/Hip Exercises: Aerobic   Nustep 5 min L5     Knee/Hip Exercises: Standing   Gait Training with crutches     Knee/Hip Exercises: Supine   Bridges with Ball Squeeze 20 reps   Straight Leg Raises 15 reps     Knee/Hip Exercises: Sidelying   Hip ABduction 20 reps     Knee/Hip Exercises: Prone   Hamstring Curl 20 reps   Hip  Extension 20 reps   Hip Extension Limitations with iso hamstring curl     Vasopneumatic   Number Minutes Vasopneumatic  15 minutes   Vasopnuematic Location  Knee  lower leg   Vasopneumatic Pressure Low   Vasopneumatic Temperature  34 deg     Manual Therapy   Manual Therapy Soft tissue mobilization   Soft tissue mobilization IASTM medial knee                PT Education - 08/10/16 1725    Education provided Yes   Education Details exercise form/rationale, gait with crutches, discussion of AFO, importance of decreasing edema, HEP   Person(s) Educated Patient   Methods Explanation;Demonstration;Tactile cues;Verbal cues;Handout   Comprehension Verbalized understanding;Returned demonstration;Verbal cues required;Tactile cues required;Need further instruction          PT Short Term Goals - 07/03/16 1636      PT SHORT TERM GOAL #1   Title Knee ROM within 10 deg of R LE by 9/7   Baseline limited to 0-90 at eval   Time 12   Period Weeks   Status New     PT SHORT TERM GOAL #2   Title Pt will demo proper heel toe gait pattern in full weight bearing for at  least 50 ft.    Baseline NWB at eval   Time 12   Period Weeks   Status New     PT SHORT TERM GOAL #3   Title Gross hip and ankle MMT 4+/5 for appropraite support of knee joint from proximal and distal structures   Baseline see flowsheet   Time 12   Period Weeks   Status New     PT SHORT TERM GOAL #4   Title Pt will verbalize compliance with, and understanding of HEP as it has been progressed at this point   Baseline began establishing at evaluation   Time 12   Period Weeks   Status New           PT Long Term Goals - 07/03/16 1642      PT LONG TERM GOAL #1   Title FOTO to 64% ability to indicate significant improvement in functional ability by 11/30   Baseline 28% ability at eval   Time 24   Period Weeks   Status New     PT LONG TERM GOAL #2   Title Pt will demo proper lunge and squat to floor for  proper lifting and standing from floor.    Baseline unable at eval   Time 24   Period Weeks   Status New     PT LONG TERM GOAL #3   Title Pt will ascend and descend stairs step over step without use of upper extremities   Baseline unable at eval   Time 24   Period Weeks   Status New     PT LONG TERM GOAL #4   Title Pt will demo steady SLS on solid and unsteady surfaces to demo proper support to knee joint   Baseline unable at eval   Time 24   Period Weeks   Status New     PT LONG TERM GOAL #5   Title Pt will begin return to running progression with mild to no discomfort from knee   Baseline unable at eval   Time 24   Period Weeks   Status New               Plan - 08/10/16 1722    Clinical Impression Statement Pt requested IASTM today due to improvements noted in medial knee pain after last time. Did not utilize TENS today due to edema in lower leg. Used theraband for DF support but instructed pt to only do so for very short periods due to strap around leg.    PT Treatment/Interventions ADLs/Self Care Home Management;Cryotherapy;Electrical Stimulation;Functional mobility training;Stair training;Gait training;DME Instruction;Moist Heat;Therapeutic activities;Therapeutic exercise;Balance training;Neuromuscular re-education;Patient/family education;Passive range of motion;Scar mobilization;Compression bandaging;Manual techniques;Taping;Vasopneumatic Device   PT Next Visit Plan bike/nustep, 50% WB per MD, how are crutches going?, gastroc stretching & strengthening   PT Home Exercise Plan ice 3/day, toe yoga, AAROM DF, quad sets; SLR, SAQ, hip abduction, quad set with long hold. seated clam, towel scrunches, long sitting PF-AAROM DF. sidelying hip abd+flx/ext, prone hip ext; prone HS curl, iso HS curl +hip ext, bridge with ball;    Consulted and Agree with Plan of Care Patient      Patient will benefit from skilled therapeutic intervention in order to improve the following  deficits and impairments:  Abnormal gait, Decreased range of motion, Difficulty walking, Increased muscle spasms, Decreased activity tolerance, Decreased knowledge of precautions, Pain, Improper body mechanics, Impaired flexibility, Decreased scar mobility, Decreased knowledge of use of DME, Decreased balance, Decreased mobility, Decreased strength,  Increased edema, Impaired sensation, Postural dysfunction  Visit Diagnosis: Acute pain of right knee  Localized edema  Difficulty in walking, not elsewhere classified  Stiffness of right knee, not elsewhere classified  Muscle weakness (generalized)     Problem List Patient Active Problem List   Diagnosis Date Noted  . Right medial tibial plateau fracture 06/24/2016    [place] C. Aahana Elza PT, DPT 08/10/16 5:26 PM   [facility] [address] [place], [place], [address] Phone: [phone]   Fax:  [phone]  Name: Stuart Cain MRN: [id number] Date of Birth: 1985/03/21  PHYSICAL THERAPY DISCHARGE SUMMARY  Visits from Start of Care: 7  Current functional level related to goals / functional outcomes: See above   Remaining deficits: See above   Education / Equipment: Anatomy of condition, POC, HEP, exercise form/rationale  Plan: Patient agrees to discharge.  Patient goals were not met. Patient is being discharged due to not returning since the last visit.  ?????    Maylen Waltermire C. Domenico Achord PT, DPT 09/14/16 8:27 AM

## 2016-08-17 ENCOUNTER — Encounter: Payer: Self-pay | Admitting: Physical Therapy

## 2018-02-14 IMAGING — RF DG TIBIA/FIBULA 2V*R*
1 series · 3 of 3 positions shown · non-contrast
Comparison: 06/24/2016

CLINICAL DATA: Internal fixation tibial plateau fracture

EXAM:
DG C-ARM 61-120 MIN; RIGHT TIBIA AND FIBULA - 2 VIEW

[Series 1: run · 3 of 3 slices shown]
[im 1/3]
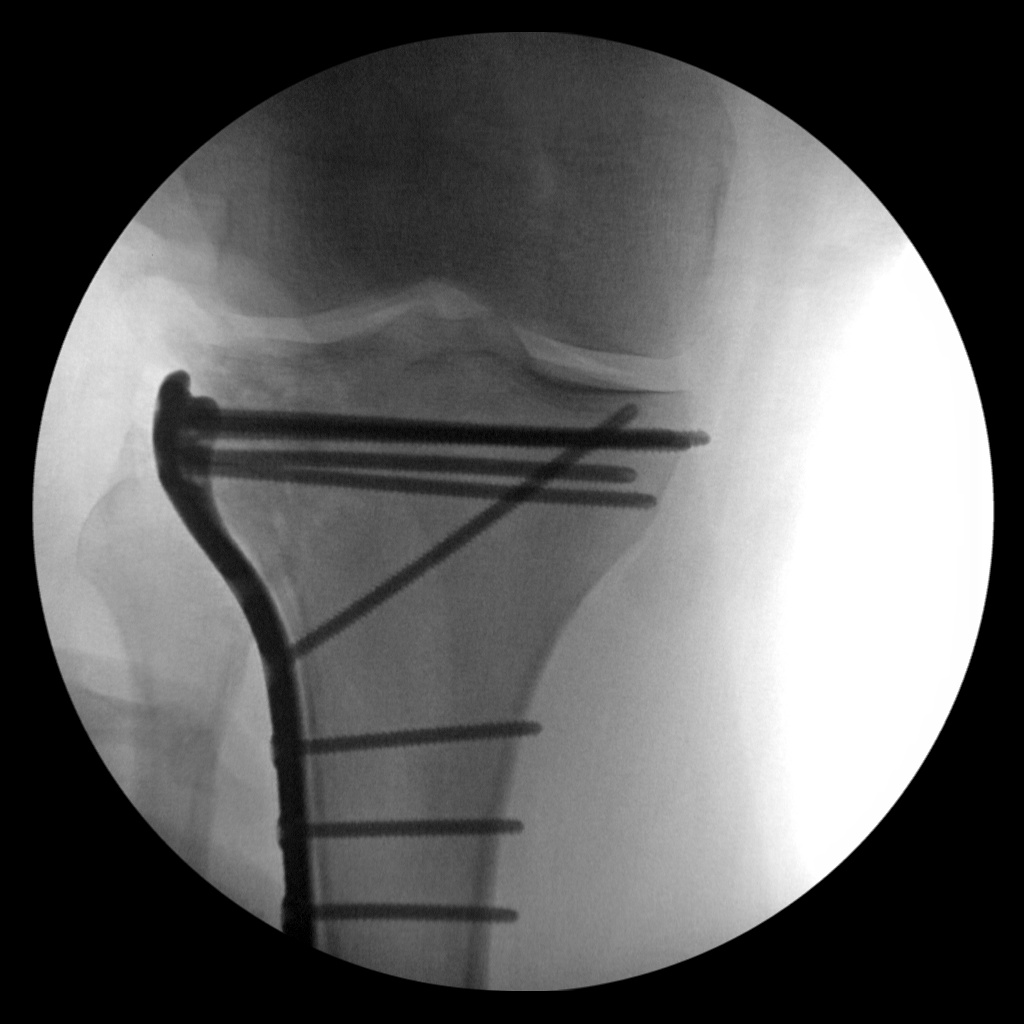
[im 2/3]
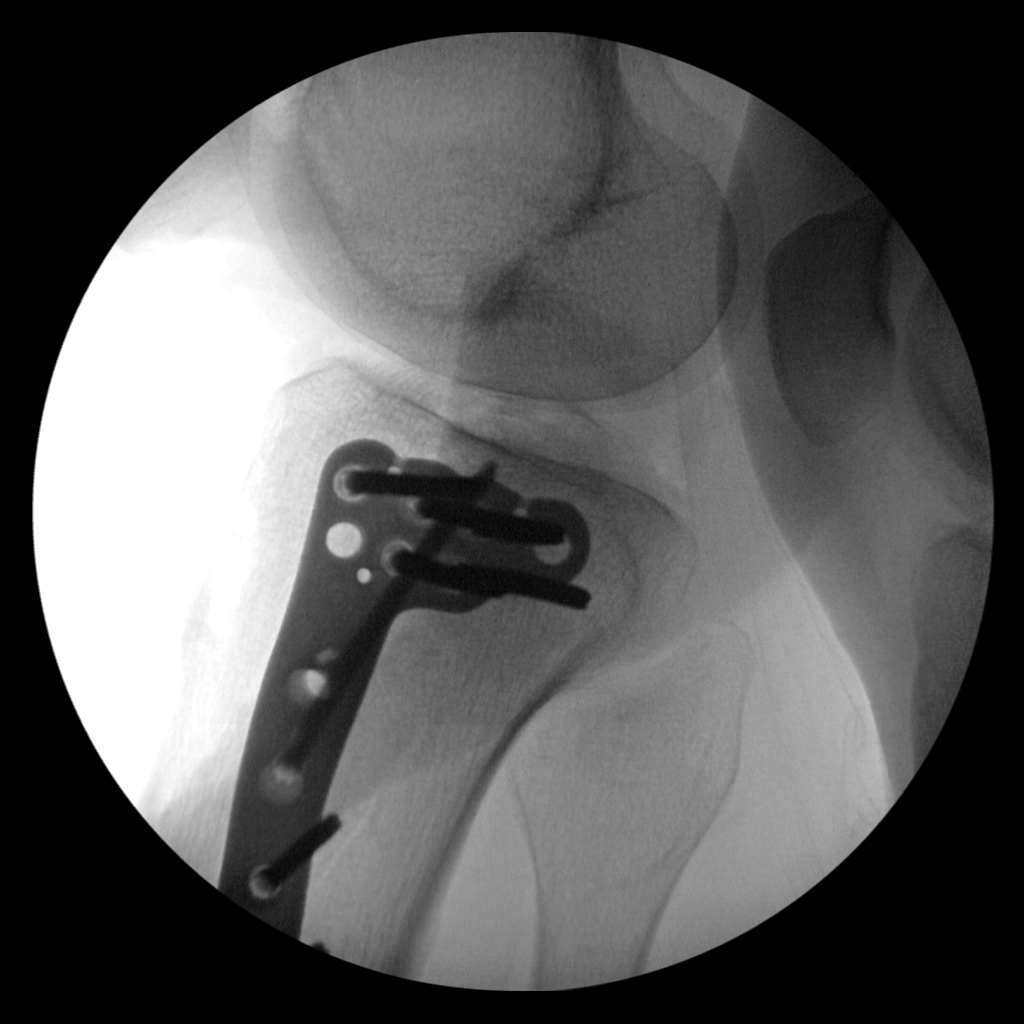
[im 3/3]
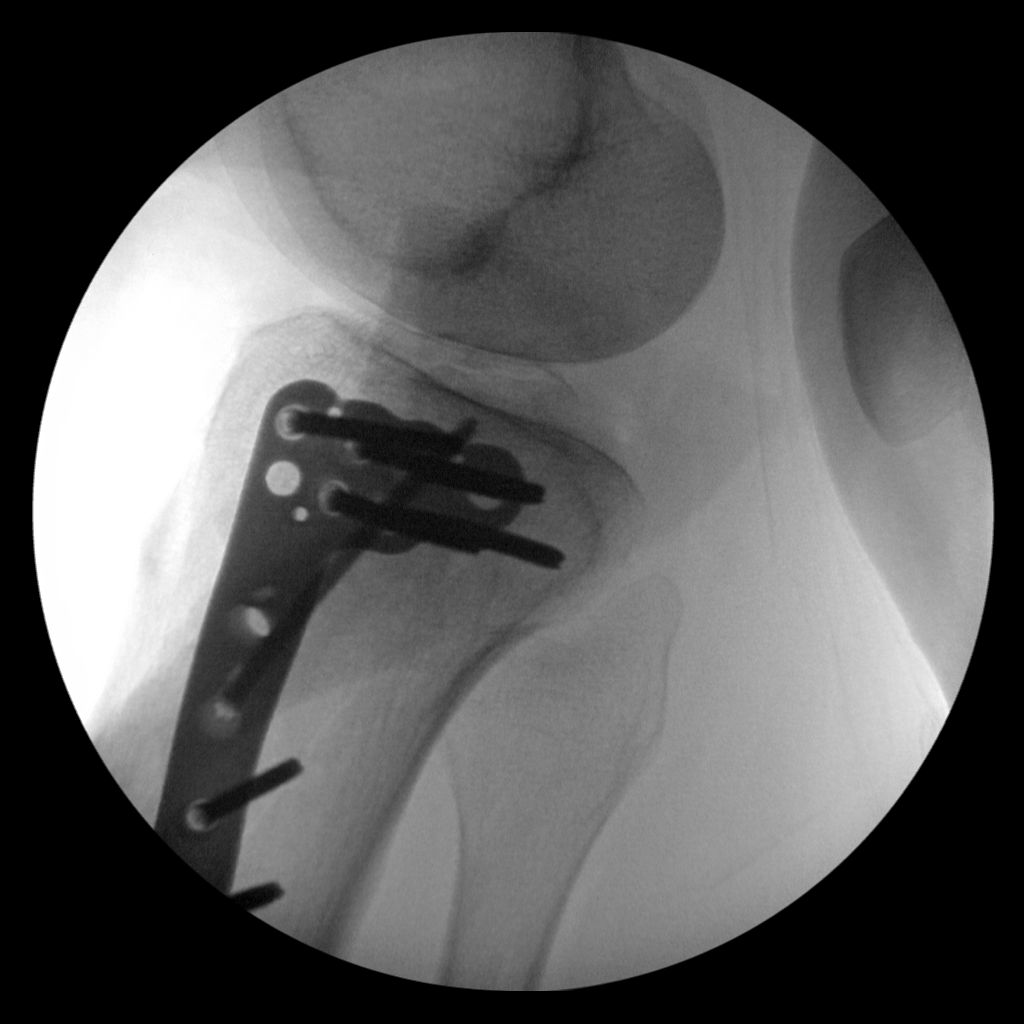

[3 of 3 positions shown; findings below may reference images not displayed]

FINDINGS: Changes of plate and screw fixation across the lateral tibial
plateau fracture. Near anatomic alignment. No hardware complicating
feature.
IMPRESSION: Internal fixation.  No visible complicating feature.

## 2018-03-15 ENCOUNTER — Other Ambulatory Visit: Payer: Self-pay

## 2018-03-15 ENCOUNTER — Encounter (HOSPITAL_COMMUNITY): Payer: Self-pay | Admitting: Emergency Medicine

## 2018-03-15 ENCOUNTER — Emergency Department (HOSPITAL_COMMUNITY)
Admission: EM | Admit: 2018-03-15 | Discharge: 2018-03-15 | Disposition: A | Discharge status: Home / Self Care | Payer: No Typology Code available for payment source | Attending: Emergency Medicine | Admitting: Emergency Medicine

## 2018-03-15 DIAGNOSIS — K0889 Other specified disorders of teeth and supporting structures: Secondary | ICD-10-CM | POA: Diagnosis present

## 2018-03-15 DIAGNOSIS — F17290 Nicotine dependence, other tobacco product, uncomplicated: Secondary | ICD-10-CM | POA: Insufficient documentation

## 2018-03-15 DIAGNOSIS — Z79899 Other long term (current) drug therapy: Secondary | ICD-10-CM | POA: Diagnosis not present

## 2018-03-15 DIAGNOSIS — K047 Periapical abscess without sinus: Secondary | ICD-10-CM

## 2018-03-15 MED ORDER — IBUPROFEN 600 MG PO TABS: 600.0000 mg | ORAL_TABLET | Freq: Four times a day (QID) | ORAL | 0 refills | Status: AC | PRN

## 2018-03-15 MED ORDER — PENICILLIN V POTASSIUM 500 MG PO TABS: 500.0000 mg | ORAL_TABLET | Freq: Four times a day (QID) | ORAL | 0 refills | Status: AC

## 2018-03-15 NOTE — ED Provider Notes (Signed)
[facility] EMERGENCY DEPARTMENT Provider Note   CSN: [id number] Arrival date & time: 03/15/18  2017    History   Chief Complaint Chief Complaint  Patient presents with  . Dental Pain    HPI Stuart Cain is a 33 y.o. male.     33 y.o male  With no PMH presents to the ED with a chief complaint of dental pain x yesterday.  Patient reports she has a missing tooth to the right front lower portion of his mouth, reports feeling a sharp pain worse with talking.  He reports taking some Advil PM which relieved his symptoms.  Not seen a dentist in a couple of years, states that he plans to set up an appointment with someone soon.  Denies any fevers, facial swelling, difficulty swallowing.     History reviewed. No pertinent past medical history.  Patient Active Problem List   Diagnosis Date Noted  . Right medial tibial plateau fracture 06/24/2016    Past Surgical History:  Procedure Laterality Date  . ORIF TIBIA PLATEAU Right 06/25/2016   Procedure: OPEN REDUCTION INTERNAL FIXATION (ORIF) TIBIAL PLATEAU WITH BONE GRAFT  AND MENISCAL REPAIR;  Surgeon: Durene Romans, MD;  Location: [facility] OR;  Service: Orthopedics;  Laterality: Right;        Home Medications    Prior to Admission medications   Medication Sig Start Date End Date Taking? Authorizing Provider  docusate sodium (COLACE) 100 MG capsule Take 1 capsule (100 mg total) by mouth 2 (two) times daily. 06/26/16   Lanney Gins, PA-C  HYDROcodone-acetaminophen (NORCO) 7.5-325 MG tablet Take 1-2 tablets by mouth every 4 (four) hours as needed for moderate pain or severe pain. 06/26/16   Lanney Gins, PA-C  ibuprofen (ADVIL,MOTRIN) 600 MG tablet Take 1 tablet (600 mg total) by mouth every 6 (six) hours as needed for up to 7 days. 03/15/18 03/22/18  Claude Manges, PA-C  methocarbamol (ROBAXIN) 500 MG tablet Take 1 tablet (500 mg total) by mouth every 6 (six) hours as needed for muscle spasms. 06/26/16   Lanney Gins,  PA-C  penicillin v potassium (VEETID) 500 MG tablet Take 1 tablet (500 mg total) by mouth 4 (four) times daily for 7 days. 03/15/18 03/22/18  Claude Manges, PA-C  polyethylene glycol (MIRALAX / GLYCOLAX) packet Take 17 g by mouth 2 (two) times daily. 06/26/16   Lanney Gins, PA-C    Family History No family history on file.  Social History Social History   Tobacco Use  . Smoking status: Light Tobacco Smoker    Types: Cigars  . Smokeless tobacco: Never Used  Substance Use Topics  . Alcohol use: Yes  . Drug use: No     Allergies   Patient has no known allergies.   Review of Systems Review of Systems  Constitutional: Negative for fever.  HENT: Positive for dental problem.      Physical Exam Updated Vital Signs BP 118/82 (BP Location: Right Arm)   Pulse 65   Temp 97.8 F (36.6 C) (Oral)   Resp 18   Ht 6' (1.829 m)   Wt 102.1 kg   SpO2 98%   BMI 30.52 kg/m   Physical Exam Vitals signs and nursing note reviewed.  Constitutional:      Appearance: He is well-developed.  HENT:     Head: Normocephalic and atraumatic.     Mouth/Throat:     Dentition: Abnormal dentition. Dental caries and dental abscesses present.     Pharynx: Oropharynx is clear.  Uvula midline.      Comments: Very poor dentition throughout the whole mouth, missing tooth on the front right side with a small amount of drainage from tooth. Eyes:     General: No scleral icterus.    Pupils: Pupils are equal, round, and reactive to light.  Neck:     Musculoskeletal: Normal range of motion.  Cardiovascular:     Heart sounds: Normal heart sounds.  Pulmonary:     Effort: Pulmonary effort is normal.     Breath sounds: Normal breath sounds. No wheezing.  Chest:     Chest wall: No tenderness.  Abdominal:     General: Bowel sounds are normal. There is no distension.     Palpations: Abdomen is soft.     Tenderness: There is no abdominal tenderness.  Musculoskeletal:        General: No tenderness or  deformity.  Skin:    General: Skin is warm and dry.  Neurological:     Mental Status: He is alert and oriented to person, place, and time.      ED Treatments / Results  Labs (all labs ordered are listed, but only abnormal results are displayed) Labs Reviewed - No data to display  EKG None  Radiology No results found.  Procedures Procedures (including critical care time)  Medications Ordered in ED Medications - No data to display   Initial Impression / Assessment and Plan / ED Course  I have reviewed the triage vital signs and the nursing notes.  Pertinent labs & imaging results that were available during my care of the patient were reviewed by me and considered in my medical decision making (see chart for details).       Patient presents with a chief complaint of dental pain. During evaluation there is poor dentition throughout al the mouth. There is a small amount of purulent discharge originating from the mouth. He denies any fevers, denies difficulty swallowing, any facial swelling.  Will place patient on antibiotics, provided with dental resources along with pain medication ibuprofen 600 mg.  Return precautions discussed at length with patient voices understanding and agreement.  Stable for discharge.  Final Clinical Impressions(s) / ED Diagnoses   Final diagnoses:  Pain, dental  Dental abscess    ED Discharge Orders         Ordered    ibuprofen (ADVIL,MOTRIN) 600 MG tablet  Every 6 hours PRN     03/15/18 2229    penicillin v potassium (VEETID) 500 MG tablet  4 times daily     03/15/18 2230           Claude Manges, PA-C 03/15/18 2233    Pricilla Loveless, MD 03/16/18 2324

## 2018-03-15 NOTE — Discharge Instructions (Signed)
I have prescribed antibiotics to clear your dental infection, please take them as directed. I have also provided medication to help with your may you may take one tablet every six hours for the next 7 days.

## 2018-03-15 NOTE — ED Triage Notes (Signed)
C/ R lower dental pain since yesterday.  States he took Aleve PM and it decreased pain to 5/10.

## 2018-03-15 NOTE — ED Notes (Signed)
Patient verbalizes understanding of discharge instructions. Opportunity for questioning and answers were provided. Armband removed by staff, pt discharged from ED.  

## 2018-06-10 ENCOUNTER — Other Ambulatory Visit: Payer: Self-pay

## 2018-06-10 ENCOUNTER — Emergency Department (HOSPITAL_COMMUNITY)
Admission: EM | Admit: 2018-06-10 | Discharge: 2018-06-10 | Disposition: A | Discharge status: Home / Self Care | Payer: No Typology Code available for payment source | Attending: Emergency Medicine | Admitting: Emergency Medicine

## 2018-06-10 ENCOUNTER — Encounter (HOSPITAL_COMMUNITY): Payer: Self-pay | Admitting: Emergency Medicine

## 2018-06-10 DIAGNOSIS — F17290 Nicotine dependence, other tobacco product, uncomplicated: Secondary | ICD-10-CM | POA: Diagnosis not present

## 2018-06-10 DIAGNOSIS — N23 Unspecified renal colic: Secondary | ICD-10-CM | POA: Diagnosis not present

## 2018-06-10 DIAGNOSIS — R1031 Right lower quadrant pain: Secondary | ICD-10-CM | POA: Diagnosis present

## 2018-06-10 DIAGNOSIS — R3129 Other microscopic hematuria: Secondary | ICD-10-CM | POA: Diagnosis not present

## 2018-06-10 HISTORY — DX: Calculus of kidney: N20.0

## 2018-06-10 LAB — CBC
HCT: 46 % (ref 39.0–52.0)
Hemoglobin: 15.8 g/dL (ref 13.0–17.0)
MCH: 30.4 pg (ref 26.0–34.0)
MCHC: 34.3 g/dL (ref 30.0–36.0)
MCV: 88.6 fL (ref 80.0–100.0)
Platelets: 235 10*3/uL (ref 150–400)
RBC: 5.19 MIL/uL (ref 4.22–5.81)
RDW: 12.6 % (ref 11.5–15.5)
WBC: 9.3 10*3/uL (ref 4.0–10.5)
nRBC: 0 % (ref 0.0–0.2)

## 2018-06-10 LAB — COMPREHENSIVE METABOLIC PANEL
ALT: 97 U/L — ABNORMAL HIGH (ref 0–44)
AST: 46 U/L — ABNORMAL HIGH (ref 15–41)
Albumin: 4.6 g/dL (ref 3.5–5.0)
Alkaline Phosphatase: 124 U/L (ref 38–126)
Anion gap: 13 (ref 5–15)
BUN: 16 mg/dL (ref 6–20)
CO2: 26 mmol/L (ref 22–32)
Calcium: 9.9 mg/dL (ref 8.9–10.3)
Chloride: 99 mmol/L (ref 98–111)
Creatinine, Ser: 1.33 mg/dL — ABNORMAL HIGH (ref 0.61–1.24)
GFR calc Af Amer: >60 mL/min (ref >60)
GFR calc non Af Amer: >60 mL/min (ref >60)
Glucose, Bld: 117 mg/dL — ABNORMAL HIGH (ref 70–99)
Potassium: 3.7 mmol/L (ref 3.5–5.1)
Sodium: 138 mmol/L (ref 135–145)
Total Bilirubin: 1.7 mg/dL — ABNORMAL HIGH (ref 0.3–1.2)
Total Protein: 7.7 g/dL (ref 6.5–8.1)

## 2018-06-10 LAB — URINALYSIS, ROUTINE W REFLEX MICROSCOPIC
Bilirubin Urine: NEGATIVE
Glucose, UA: NEGATIVE mg/dL
Ketones, ur: NEGATIVE mg/dL
Leukocytes,Ua: NEGATIVE
Nitrite: NEGATIVE
Protein, ur: 30 mg/dL — AB
Specific Gravity, Urine: 1.029 (ref 1.005–1.030)
pH: 5 (ref 5.0–8.0)

## 2018-06-10 LAB — LIPASE, BLOOD: Lipase: 30 U/L (ref 11–51)

## 2018-06-10 MED ORDER — ONDANSETRON HCL 4 MG/2ML IJ SOLN
4.0000 mg | Freq: Once | INTRAMUSCULAR | Status: AC | PRN
  Administered 2018-06-10: 4 mg via INTRAVENOUS
  Filled 2018-06-10: qty 2

## 2018-06-10 MED ORDER — ONDANSETRON 4 MG PO TBDP: 4.0000 mg | ORAL_TABLET | Freq: Three times a day (TID) | ORAL | 0 refills | Status: AC | PRN

## 2018-06-10 MED ORDER — SODIUM CHLORIDE 0.9 % IV BOLUS
1000.0000 mL | Freq: Once | INTRAVENOUS | Status: AC
  Administered 2018-06-10: 04:00:00 1000 mL via INTRAVENOUS

## 2018-06-10 MED ORDER — IBUPROFEN 600 MG PO TABS: 600.0000 mg | ORAL_TABLET | Freq: Four times a day (QID) | ORAL | 0 refills | Status: AC | PRN

## 2018-06-10 MED ORDER — TAMSULOSIN HCL 0.4 MG PO CAPS: 0.4000 mg | ORAL_CAPSULE | Freq: Every day | ORAL | 0 refills | Status: AC

## 2018-06-10 MED ORDER — KETOROLAC TROMETHAMINE 15 MG/ML IJ SOLN
15.0000 mg | Freq: Once | INTRAMUSCULAR | Status: AC
  Administered 2018-06-10: 06:00:00 15 mg via INTRAVENOUS
  Filled 2018-06-10: qty 1

## 2018-06-10 MED ORDER — TAMSULOSIN HCL 0.4 MG PO CAPS
0.4000 mg | ORAL_CAPSULE | Freq: Once | ORAL | Status: AC
  Administered 2018-06-10: 0.4 mg via ORAL
  Filled 2018-06-10: qty 1

## 2018-06-10 NOTE — ED Notes (Signed)
Patient stated he has called and updated family we don't need to.

## 2018-06-10 NOTE — ED Triage Notes (Signed)
Pt states he started to have right sided flank and abd pain. Pt reports this started yesterday afternoon. Pt reports he fell asleep and then woke up vomiting. Pt reports 3 episodes of vomiting.

## 2018-06-10 NOTE — ED Notes (Signed)
Patient verbalized understanding of discharge instructions and denies any further needs or questions at this time. VS stable. Patient ambulatory with steady gait.  

## 2018-06-10 NOTE — ED Provider Notes (Signed)
[facility] Provider Note  CSN: [id number] Arrival date & time: 06/10/18 0343  Chief Complaint(s) Flank Pain and Nephrolithiasis  HPI Stuart Cain is a 33 y.o. male with a history of renal stones who presents to the emergency department with right-sided flank pain that began yesterday afternoon.  Since symptoms subsided but returned more severe an hour ago.  Pain is a cramping shooting pain that radiates down to the right groin.  Associated with nausea and nonbloody nonbilious emesis.  Also endorsing dark urine.  No fevers or chills.  No chest pain or shortness of breath.  No diarrhea.  Similar to prior renal stones, but less intense.  HPI  Past Medical History Past Medical History:  Diagnosis Date  . Kidney stones    Patient Active Problem List   Diagnosis Date Noted  . Right medial tibial plateau fracture 06/24/2016   Home Medication(s) Prior to Admission medications   Medication Sig Start Date End Date Taking? Authorizing Provider  docusate sodium (COLACE) 100 MG capsule Take 1 capsule (100 mg total) by mouth 2 (two) times daily. Patient not taking: Reported on 06/10/2018 06/26/16   Lanney GinsBabish, Matthew, PA-C  HYDROcodone-acetaminophen (NORCO) 7.5-325 MG tablet Take 1-2 tablets by mouth every 4 (four) hours as needed for moderate pain or severe pain. Patient not taking: Reported on 06/10/2018 06/26/16   Lanney GinsBabish, Matthew, PA-C  ibuprofen (ADVIL) 600 MG tablet Take 1 tablet (600 mg total) by mouth every 6 (six) hours as needed. 06/10/18   Melenie Minniear, Amadeo GarnetPedro Eduardo, MD  methocarbamol (ROBAXIN) 500 MG tablet Take 1 tablet (500 mg total) by mouth every 6 (six) hours as needed for muscle spasms. Patient not taking: Reported on 06/10/2018 06/26/16   Lanney GinsBabish, Matthew, PA-C  ondansetron (ZOFRAN ODT) 4 MG disintegrating tablet Take 1 tablet (4 mg total) by mouth every 8 (eight) hours as needed for up to 3 days for nausea or vomiting. 06/10/18 06/13/18  Tiquan Bouch, Amadeo GarnetPedro  Eduardo, MD  polyethylene glycol (MIRALAX / Ethelene HalGLYCOLAX) packet Take 17 g by mouth 2 (two) times daily. Patient not taking: Reported on 06/10/2018 06/26/16   Lanney GinsBabish, Matthew, PA-C  tamsulosin (FLOMAX) 0.4 MG CAPS capsule Take 1 capsule (0.4 mg total) by mouth daily for 10 days. 06/10/18 06/20/18  Nira Connardama, Juris Gosnell Eduardo, MD                                                                                                                                    Past Surgical History Past Surgical History:  Procedure Laterality Date  . ORIF TIBIA PLATEAU Right 06/25/2016   Procedure: OPEN REDUCTION INTERNAL FIXATION (ORIF) TIBIAL PLATEAU WITH BONE GRAFT  AND MENISCAL REPAIR;  Surgeon: Durene Romanslin, Matthew, MD;  Location: [facility] OR;  Service: Orthopedics;  Laterality: Right;   Family History No family history on file.  Social History Social History   Tobacco Use  . Smoking status: Light Tobacco Smoker  Types: Cigars  . Smokeless tobacco: Never Used  Substance Use Topics  . Alcohol use: Yes  . Drug use: No   Allergies Patient has no known allergies.  Review of Systems Review of Systems All other systems are reviewed and are negative for acute change except as noted in the HPI  Physical Exam Vital Signs  I have reviewed the triage vital signs BP 113/67 (BP Location: Right Arm)   Pulse 68   Temp 97.8 F (36.6 C) (Oral)   Resp 14   Ht 6' (1.829 m)   Wt 97.5 kg   SpO2 96%   BMI 29.16 kg/m   Physical Exam Vitals signs reviewed.  Constitutional:      General: He is not in acute distress.    Appearance: He is well-developed. He is not diaphoretic.  HENT:     Head: Normocephalic and atraumatic.     Jaw: No trismus.     Right Ear: External ear normal.     Left Ear: External ear normal.     Nose: Nose normal.  Eyes:     General: No scleral icterus.    Conjunctiva/sclera: Conjunctivae normal.  Neck:     Musculoskeletal: Normal range of motion.     Trachea: Phonation normal.  Cardiovascular:      Rate and Rhythm: Normal rate and regular rhythm.  Pulmonary:     Effort: Pulmonary effort is normal. No respiratory distress.     Breath sounds: No stridor.  Abdominal:     General: There is no distension.     Tenderness: There is abdominal tenderness (mild discomfort). There is right CVA tenderness.  Musculoskeletal: Normal range of motion.  Neurological:     Mental Status: He is alert and oriented to person, place, and time.  Psychiatric:        Behavior: Behavior normal.     ED Results and Treatments Labs (all labs ordered are listed, but only abnormal results are displayed) Labs Reviewed  COMPREHENSIVE METABOLIC PANEL - Abnormal; Notable for the following components:      Result Value   Glucose, Bld 117 (*)    Creatinine, Ser 1.33 (*)    AST 46 (*)    ALT 97 (*)    Total Bilirubin 1.7 (*)    All other components within normal limits  URINALYSIS, ROUTINE W REFLEX MICROSCOPIC - Abnormal; Notable for the following components:   Color, Urine AMBER (*)    APPearance HAZY (*)    Hgb urine dipstick LARGE (*)    Protein, ur 30 (*)    Bacteria, UA FEW (*)    All other components within normal limits  LIPASE, BLOOD  CBC                                                                                                                         EKG  EKG Interpretation  Date/Time:    Ventricular Rate:    PR Interval:    QRS Duration:  QT Interval:    QTC Calculation:   R Axis:     Text Interpretation:        Radiology No results found. Pertinent labs & imaging results that were available during my care of the patient were reviewed by me and considered in my medical decision making (see chart for details).  Medications Ordered in ED Medications  ketorolac (TORADOL) 15 MG/ML injection 15 mg (has no administration in time range)  tamsulosin (FLOMAX) capsule 0.4 mg (has no administration in time range)  ondansetron (ZOFRAN) injection 4 mg (4 mg Intravenous Given 06/10/18  0410)  sodium chloride 0.9 % bolus 1,000 mL (1,000 mLs Intravenous New Bag/Given 06/10/18 0410)                                                                                                                                    Procedures Procedures  (including critical care time)  Medical Decision Making / ED Course I have reviewed the nursing notes for this encounter and the patient's prior records (if available in EHR or on provided paperwork).    Right flank pain consistent with his prior renal stone pain.  Labs reassuring without leukocytosis.  Patient with mild AKI likely from dehydration due to emesis.  Provided with IV fluids.  Urine with hematuria but no evidence of infection.  Patient was provided with antiemetic and Toradol resulting in complete resolution of his pain.  Low suspicion for other serious intra-abdominal inflammatory/infectious process.  Provided with oral fluids and first dose of Flomax.  Able to tolerate oral intake.  The patient appears reasonably screened and/or stabilized for discharge and I doubt any other medical condition or other [facility] requiring further screening, evaluation, or treatment in the ED at this time prior to discharge.  The patient is safe for discharge with strict return precautions.   Final Clinical Impression(s) / ED Diagnoses Final diagnoses:  Renal colic on right side  Other microscopic hematuria   Disposition: Discharge  Condition: Good  I have discussed the results, Dx and Tx plan with the patient who expressed understanding and agree(s) with the plan. Discharge instructions discussed at great length. The patient was given strict return precautions who verbalized understanding of the instructions. No further questions at time of discharge.    ED Discharge Orders         Ordered    ibuprofen (ADVIL) 600 MG tablet  Every 6 hours PRN     06/10/18 0545    tamsulosin (FLOMAX) 0.4 MG CAPS capsule  Daily     06/10/18 0545     ondansetron (ZOFRAN ODT) 4 MG disintegrating tablet  Every 8 hours PRN     06/10/18 0545           Follow Up: [facility] [address] [place] [address] [phone] Schedule an appointment as soon as possible for a visit  As needed  This chart was dictated using voice recognition software.  Despite best efforts to proofread,  errors can occur which can change the documentation meaning.   Nira Conn, MD 06/10/18 [phone]

## 2022-12-19 ENCOUNTER — Other Ambulatory Visit: Payer: Self-pay

## 2022-12-19 ENCOUNTER — Emergency Department (HOSPITAL_COMMUNITY)

## 2022-12-19 ENCOUNTER — Emergency Department (HOSPITAL_COMMUNITY)
Admission: EM | Admit: 2022-12-19 | Discharge: 2022-12-19 | Disposition: A | Discharge status: Home / Self Care | Attending: Emergency Medicine | Admitting: Emergency Medicine

## 2022-12-19 ENCOUNTER — Encounter (HOSPITAL_COMMUNITY): Payer: Self-pay | Admitting: Emergency Medicine

## 2022-12-19 DIAGNOSIS — R7402 Elevation of levels of lactic acid dehydrogenase (LDH): Secondary | ICD-10-CM | POA: Diagnosis not present

## 2022-12-19 DIAGNOSIS — S71102A Unspecified open wound, left thigh, initial encounter: Secondary | ICD-10-CM | POA: Diagnosis present

## 2022-12-19 DIAGNOSIS — Z23 Encounter for immunization: Secondary | ICD-10-CM | POA: Diagnosis not present

## 2022-12-19 DIAGNOSIS — X58XXXA Exposure to other specified factors, initial encounter: Secondary | ICD-10-CM | POA: Diagnosis not present

## 2022-12-19 DIAGNOSIS — R000 Tachycardia, unspecified: Secondary | ICD-10-CM | POA: Insufficient documentation

## 2022-12-19 DIAGNOSIS — W3400XA Accidental discharge from unspecified firearms or gun, initial encounter: Secondary | ICD-10-CM

## 2022-12-19 LAB — COMPREHENSIVE METABOLIC PANEL
ALT: 50 U/L — ABNORMAL HIGH (ref 0–44)
AST: 42 U/L — ABNORMAL HIGH (ref 15–41)
Albumin: 4.6 g/dL (ref 3.5–5.0)
Alkaline Phosphatase: 96 U/L (ref 38–126)
Anion gap: 14 (ref 5–15)
BUN: 17 mg/dL (ref 6–20)
CO2: 18 mmol/L — ABNORMAL LOW (ref 22–32)
Calcium: 9.4 mg/dL (ref 8.9–10.3)
Chloride: 103 mmol/L (ref 98–111)
Creatinine, Ser: 1.28 mg/dL — ABNORMAL HIGH (ref 0.61–1.24)
GFR, Estimated: >60 mL/min (ref >60)
Glucose, Bld: 175 mg/dL — ABNORMAL HIGH (ref 70–99)
Potassium: 3.6 mmol/L (ref 3.5–5.1)
Sodium: 135 mmol/L (ref 135–145)
Total Bilirubin: 1.1 mg/dL (ref <1.2)
Total Protein: 7.6 g/dL (ref 6.5–8.1)

## 2022-12-19 LAB — CBC
HCT: 45.7 % (ref 39.0–52.0)
Hemoglobin: 15.6 g/dL (ref 13.0–17.0)
MCH: 30 pg (ref 26.0–34.0)
MCHC: 34.1 g/dL (ref 30.0–36.0)
MCV: 87.9 fL (ref 80.0–100.0)
Platelets: 209 10*3/uL (ref 150–400)
RBC: 5.2 MIL/uL (ref 4.22–5.81)
RDW: 12.6 % (ref 11.5–15.5)
WBC: 9.5 10*3/uL (ref 4.0–10.5)
nRBC: 0 % (ref 0.0–0.2)

## 2022-12-19 LAB — I-STAT CHEM 8, ED
BUN: 23 mg/dL — ABNORMAL HIGH (ref 6–20)
Calcium, Ion: 1.09 mmol/L — ABNORMAL LOW (ref 1.15–1.40)
Chloride: 103 mmol/L (ref 98–111)
Creatinine, Ser: 1.2 mg/dL (ref 0.61–1.24)
Glucose, Bld: 173 mg/dL — ABNORMAL HIGH (ref 70–99)
HCT: 48 % (ref 39.0–52.0)
Hemoglobin: 16.3 g/dL (ref 13.0–17.0)
Potassium: 4.2 mmol/L (ref 3.5–5.1)
Sodium: 139 mmol/L (ref 135–145)
TCO2: 22 mmol/L (ref 22–32)

## 2022-12-19 LAB — PROTIME-INR
INR: 0.9 (ref 0.8–1.2)
Prothrombin Time: 12.2 s (ref 11.4–15.2)

## 2022-12-19 LAB — I-STAT CG4 LACTIC ACID, ED: Lactic Acid, Venous: 5 mmol/L (ref 0.5–1.9)

## 2022-12-19 LAB — SAMPLE TO BLOOD BANK

## 2022-12-19 LAB — ETHANOL: Alcohol, Ethyl (B): <10 mg/dL (ref <10)

## 2022-12-19 MED ORDER — FENTANYL CITRATE PF 50 MCG/ML IJ SOSY
50.0000 ug | PREFILLED_SYRINGE | Freq: Once | INTRAMUSCULAR | Status: AC
  Administered 2022-12-19: 50 ug via INTRAVENOUS
  Filled 2022-12-19: qty 1

## 2022-12-19 MED ORDER — IBUPROFEN 600 MG PO TABS: 600.0000 mg | ORAL_TABLET | Freq: Four times a day (QID) | ORAL | 0 refills | Status: AC | PRN

## 2022-12-19 MED ORDER — CEFAZOLIN SODIUM-DEXTROSE 2-4 GM/100ML-% IV SOLN
2.0000 g | Freq: Once | INTRAVENOUS | Status: AC
  Administered 2022-12-19: 2 g via INTRAVENOUS
  Filled 2022-12-19: qty 100

## 2022-12-19 MED ORDER — OXYCODONE-ACETAMINOPHEN 5-325 MG PO TABS: 1.0000 | ORAL_TABLET | Freq: Four times a day (QID) | ORAL | 0 refills | Status: AC | PRN

## 2022-12-19 MED ORDER — HYDROCODONE-ACETAMINOPHEN 5-325 MG PO TABS
1.0000 | ORAL_TABLET | Freq: Once | ORAL | Status: AC
  Administered 2022-12-19: 1 via ORAL
  Filled 2022-12-19: qty 1

## 2022-12-19 MED ORDER — TETANUS-DIPHTH-ACELL PERTUSSIS 5-2.5-18.5 LF-MCG/0.5 IM SUSY
0.5000 mL | PREFILLED_SYRINGE | Freq: Once | INTRAMUSCULAR | Status: AC
  Administered 2022-12-19: 0.5 mL via INTRAMUSCULAR
  Filled 2022-12-19: qty 0.5

## 2022-12-19 MED ORDER — SODIUM CHLORIDE 0.9 % IV BOLUS
500.0000 mL | Freq: Once | INTRAVENOUS | Status: AC
  Administered 2022-12-19: 500 mL via INTRAVENOUS
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
## 2022-12-19 NOTE — ED Notes (Signed)
ABI:  Left - 1.11 Right - 1.08
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
## 2022-12-19 NOTE — ED Notes (Signed)
Tourniquet removed by EDP Madilyn Hook. Pressure dressing applied.
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
## 2022-12-26 ENCOUNTER — Encounter (HOSPITAL_COMMUNITY): Payer: Self-pay | Admitting: Emergency Medicine

## 2022-12-26 ENCOUNTER — Emergency Department (HOSPITAL_COMMUNITY)
Admission: EM | Admit: 2022-12-26 | Discharge: 2022-12-26 | Disposition: A | Discharge status: Home / Self Care | Attending: Emergency Medicine | Admitting: Emergency Medicine

## 2022-12-26 ENCOUNTER — Other Ambulatory Visit: Payer: Self-pay

## 2022-12-26 DIAGNOSIS — R030 Elevated blood-pressure reading, without diagnosis of hypertension: Secondary | ICD-10-CM | POA: Insufficient documentation

## 2022-12-26 DIAGNOSIS — R1010 Upper abdominal pain, unspecified: Secondary | ICD-10-CM | POA: Insufficient documentation

## 2022-12-26 DIAGNOSIS — R7989 Other specified abnormal findings of blood chemistry: Secondary | ICD-10-CM | POA: Insufficient documentation

## 2022-12-26 LAB — CBC
HCT: 41.6 % (ref 39.0–52.0)
Hemoglobin: 14.3 g/dL (ref 13.0–17.0)
MCH: 30.2 pg (ref 26.0–34.0)
MCHC: 34.4 g/dL (ref 30.0–36.0)
MCV: 87.9 fL (ref 80.0–100.0)
Platelets: 235 10*3/uL (ref 150–400)
RBC: 4.73 MIL/uL (ref 4.22–5.81)
RDW: 12.6 % (ref 11.5–15.5)
WBC: 9.5 10*3/uL (ref 4.0–10.5)
nRBC: 0 % (ref 0.0–0.2)

## 2022-12-26 LAB — COMPREHENSIVE METABOLIC PANEL
ALT: 41 U/L (ref 0–44)
AST: 28 U/L (ref 15–41)
Albumin: 4.2 g/dL (ref 3.5–5.0)
Alkaline Phosphatase: 85 U/L (ref 38–126)
Anion gap: 12 (ref 5–15)
BUN: 18 mg/dL (ref 6–20)
CO2: 25 mmol/L (ref 22–32)
Calcium: 9.6 mg/dL (ref 8.9–10.3)
Chloride: 103 mmol/L (ref 98–111)
Creatinine, Ser: 1.34 mg/dL — ABNORMAL HIGH (ref 0.61–1.24)
GFR, Estimated: >60 mL/min (ref >60)
Glucose, Bld: 95 mg/dL (ref 70–99)
Potassium: 3.9 mmol/L (ref 3.5–5.1)
Sodium: 140 mmol/L (ref 135–145)
Total Bilirubin: 1.1 mg/dL (ref <1.2)
Total Protein: 7.4 g/dL (ref 6.5–8.1)

## 2022-12-26 LAB — LIPASE, BLOOD: Lipase: 26 U/L (ref 11–51)

## 2022-12-26 MED ORDER — PANTOPRAZOLE SODIUM 40 MG PO TBEC: 40.0000 mg | DELAYED_RELEASE_TABLET | Freq: Every day | ORAL | 0 refills | Status: AC
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
## 2022-12-26 NOTE — Discharge Instructions (Addendum)
It was our pleasure to provide your ER care today - we hope that you feel better.  Take protonix (acid blocker medication) as prescribed. For recent abdominal pain, follow up with primary care doctor in the next 1-2 weeks - if symptoms recur/persist, discuss possible imaging.   From today's labs, your kidney function test (creatinine) is mildly high - make sure to drink plenty of fluids/stay well hydrated, and avoid taking anti-inflammatory type of pain medication such as ibuprofen/motrin, or naprosyn/aleve.   Follow up with primary care doctor in the coming week for recheck. Also have your blood pressure rechecked then, as it is high today.   Return to ER if worse, new symptoms, fevers, new or worsening or severe abdominal pain, persistent vomiting, fevers, or other concern.
